# Patient Record
Sex: Male | Born: 1965 | Hispanic: No | Marital: Married | Smoking: Never smoker
Health system: Southern US, Community
[De-identification: ages and names within clinical notes are randomized; demographics above are authoritative.]

## PROBLEM LIST (undated history)

## (undated) DIAGNOSIS — G4733 Obstructive sleep apnea (adult) (pediatric): Secondary | ICD-10-CM

## (undated) DIAGNOSIS — T1491XA Suicide attempt, initial encounter: Secondary | ICD-10-CM

## (undated) DIAGNOSIS — I10 Essential (primary) hypertension: Secondary | ICD-10-CM

## (undated) DIAGNOSIS — R519 Headache, unspecified: Secondary | ICD-10-CM

## (undated) DIAGNOSIS — G8929 Other chronic pain: Secondary | ICD-10-CM

## (undated) DIAGNOSIS — M549 Dorsalgia, unspecified: Secondary | ICD-10-CM

## (undated) DIAGNOSIS — T4145XA Adverse effect of unspecified anesthetic, initial encounter: Secondary | ICD-10-CM

## (undated) DIAGNOSIS — F419 Anxiety disorder, unspecified: Secondary | ICD-10-CM

## (undated) DIAGNOSIS — T8859XA Other complications of anesthesia, initial encounter: Secondary | ICD-10-CM

## (undated) DIAGNOSIS — R51 Headache: Secondary | ICD-10-CM

## (undated) HISTORY — PX: CHOLECYSTECTOMY: SHX55

---

## 2006-03-30 ENCOUNTER — Observation Stay (HOSPITAL_COMMUNITY): Admission: EM | Admit: 2006-03-30 | Discharge: 2006-03-31 | Payer: Self-pay | Admitting: Emergency Medicine

## 2006-04-14 ENCOUNTER — Encounter: Admission: RE | Admit: 2006-04-14 | Discharge: 2006-07-13 | Payer: Self-pay | Admitting: Internal Medicine

## 2012-02-28 ENCOUNTER — Encounter (HOSPITAL_COMMUNITY): Payer: Self-pay | Admitting: *Deleted

## 2012-02-28 ENCOUNTER — Observation Stay (HOSPITAL_COMMUNITY)
Admission: EM | Admit: 2012-02-28 | Discharge: 2012-03-01 | Disposition: A | Discharge status: Home / Self Care | Payer: 59 | Attending: Cardiology | Admitting: Cardiology

## 2012-02-28 ENCOUNTER — Emergency Department (HOSPITAL_COMMUNITY): Payer: 59

## 2012-02-28 DIAGNOSIS — D649 Anemia, unspecified: Secondary | ICD-10-CM | POA: Insufficient documentation

## 2012-02-28 DIAGNOSIS — E669 Obesity, unspecified: Secondary | ICD-10-CM

## 2012-02-28 DIAGNOSIS — R079 Chest pain, unspecified: Principal | ICD-10-CM

## 2012-02-28 DIAGNOSIS — R0602 Shortness of breath: Secondary | ICD-10-CM | POA: Insufficient documentation

## 2012-02-28 DIAGNOSIS — I2510 Atherosclerotic heart disease of native coronary artery without angina pectoris: Secondary | ICD-10-CM | POA: Insufficient documentation

## 2012-02-28 DIAGNOSIS — I10 Essential (primary) hypertension: Secondary | ICD-10-CM | POA: Diagnosis present

## 2012-02-28 DIAGNOSIS — Z8249 Family history of ischemic heart disease and other diseases of the circulatory system: Secondary | ICD-10-CM | POA: Insufficient documentation

## 2012-02-28 DIAGNOSIS — E1169 Type 2 diabetes mellitus with other specified complication: Secondary | ICD-10-CM | POA: Diagnosis present

## 2012-02-28 DIAGNOSIS — E781 Pure hyperglyceridemia: Secondary | ICD-10-CM

## 2012-02-28 DIAGNOSIS — G4733 Obstructive sleep apnea (adult) (pediatric): Secondary | ICD-10-CM

## 2012-02-28 DIAGNOSIS — E119 Type 2 diabetes mellitus without complications: Secondary | ICD-10-CM

## 2012-02-28 HISTORY — DX: Obstructive sleep apnea (adult) (pediatric): G47.33

## 2012-02-28 HISTORY — DX: Essential (primary) hypertension: I10

## 2012-02-28 LAB — GLUCOSE, CAPILLARY
Glucose-Capillary: 63 mg/dL — ABNORMAL LOW (ref 70–99)
Glucose-Capillary: 111 mg/dL — ABNORMAL HIGH (ref 70–99)

## 2012-02-28 LAB — COMPREHENSIVE METABOLIC PANEL
AST: 18 U/L (ref 0–37)
Albumin: 3.4 g/dL — ABNORMAL LOW (ref 3.5–5.2)
Calcium: 9 mg/dL (ref 8.4–10.5)
Chloride: 105 mEq/L (ref 96–112)
Creatinine, Ser: 0.89 mg/dL (ref 0.50–1.35)
Total Protein: 7.2 g/dL (ref 6.0–8.3)

## 2012-02-28 LAB — CBC WITH DIFFERENTIAL/PLATELET
Basophils Absolute: 0 10*3/uL (ref 0.0–0.1)
Basophils Relative: 0 % (ref 0–1)
Eosinophils Relative: 3 % (ref 0–5)
HCT: 38.7 % — ABNORMAL LOW (ref 39.0–52.0)
MCHC: 32 g/dL (ref 30.0–36.0)
Monocytes Absolute: 0.6 10*3/uL (ref 0.1–1.0)
Neutro Abs: 7 10*3/uL (ref 1.7–7.7)
RDW: 14.9 % (ref 11.5–15.5)

## 2012-02-28 LAB — CBC
MCHC: 31.9 g/dL (ref 30.0–36.0)
RDW: 14.8 % (ref 11.5–15.5)

## 2012-02-28 LAB — CREATININE, SERUM
Creatinine, Ser: 0.97 mg/dL (ref 0.50–1.35)
GFR calc non Af Amer: >90 mL/min (ref >90)

## 2012-02-28 LAB — CARDIAC PANEL(CRET KIN+CKTOT+MB+TROPI)
CK, MB: 3.1 ng/mL (ref 0.3–4.0)
Relative Index: 1.5 (ref 0.0–2.5)
Total CK: 210 U/L (ref 7–232)
Troponin I: <0.3 ng/mL (ref <0.30)

## 2012-02-28 MED ORDER — SODIUM CHLORIDE 0.9 % IJ SOLN: 3.0000 mL | INTRAMUSCULAR | Status: DC | PRN

## 2012-02-28 MED ORDER — ONDANSETRON HCL 4 MG/2ML IJ SOLN: 4.0000 mg | Freq: Four times a day (QID) | INTRAMUSCULAR | Status: DC | PRN

## 2012-02-28 MED ORDER — GLIMEPIRIDE 4 MG PO TABS
4.0000 mg | ORAL_TABLET | Freq: Every day | ORAL | Status: DC
  Administered 2012-03-01: 4 mg via ORAL
  Filled 2012-02-28 (×3): qty 1

## 2012-02-28 MED ORDER — ACETAMINOPHEN 325 MG PO TABS: 650.0000 mg | ORAL_TABLET | ORAL | Status: DC | PRN

## 2012-02-28 MED ORDER — INSULIN ASPART 100 UNIT/ML ~~LOC~~ SOLN: 0.0000 [IU] | Freq: Three times a day (TID) | SUBCUTANEOUS | Status: DC

## 2012-02-28 MED ORDER — ENOXAPARIN SODIUM 150 MG/ML ~~LOC~~ SOLN
1.0000 mg/kg | Freq: Two times a day (BID) | SUBCUTANEOUS | Status: DC
  Filled 2012-02-28 (×2): qty 1

## 2012-02-28 MED ORDER — PANTOPRAZOLE SODIUM 40 MG PO TBEC
40.0000 mg | DELAYED_RELEASE_TABLET | Freq: Every day | ORAL | Status: DC
  Administered 2012-02-28 – 2012-03-01 (×2): 40 mg via ORAL
  Filled 2012-02-28 (×2): qty 1

## 2012-02-28 MED ORDER — EXENATIDE 10 MCG/0.04ML ~~LOC~~ SOPN: 10.0000 ug | PEN_INJECTOR | Freq: Two times a day (BID) | SUBCUTANEOUS | Status: DC

## 2012-02-28 MED ORDER — LISINOPRIL 20 MG PO TABS
20.0000 mg | ORAL_TABLET | Freq: Every day | ORAL | Status: DC
  Administered 2012-02-28 – 2012-03-01 (×3): 20 mg via ORAL
  Filled 2012-02-28 (×3): qty 1

## 2012-02-28 MED ORDER — ASPIRIN EC 81 MG PO TBEC
81.0000 mg | DELAYED_RELEASE_TABLET | Freq: Every day | ORAL | Status: DC
  Administered 2012-02-29 – 2012-03-01 (×2): 81 mg via ORAL
  Filled 2012-02-28 (×2): qty 1

## 2012-02-28 MED ORDER — ENOXAPARIN SODIUM 150 MG/ML ~~LOC~~ SOLN
150.0000 mg | Freq: Two times a day (BID) | SUBCUTANEOUS | Status: DC
  Administered 2012-02-28 – 2012-03-01 (×4): 150 mg via SUBCUTANEOUS
  Filled 2012-02-28 (×5): qty 1

## 2012-02-28 MED ORDER — NITROGLYCERIN 0.4 MG SL SUBL: 0.4000 mg | SUBLINGUAL_TABLET | SUBLINGUAL | Status: DC | PRN

## 2012-02-28 MED ORDER — SODIUM CHLORIDE 0.9 % IV SOLN: 250.0000 mL | INTRAVENOUS | Status: DC | PRN

## 2012-02-28 MED ORDER — INSULIN ASPART 100 UNIT/ML ~~LOC~~ SOLN: 0.0000 [IU] | Freq: Every day | SUBCUTANEOUS | Status: DC

## 2012-02-28 MED ORDER — SODIUM CHLORIDE 0.9 % IJ SOLN
3.0000 mL | Freq: Two times a day (BID) | INTRAMUSCULAR | Status: DC
  Administered 2012-02-28 – 2012-03-01 (×4): 3 mL via INTRAVENOUS

## 2012-02-28 NOTE — Progress Notes (Signed)
Initial CBG 59. Repeat CBG 166. Given hypoglycemic event with DM, will restart Byetta tomorrow. Will also write to continue Amaryl starting tomorrow with parameter to hold if NPO. Hold metformin for now in case he needs cath. Allyana Vogan PA-C

## 2012-02-28 NOTE — ED Notes (Signed)
Received report from Ed RN

## 2012-02-28 NOTE — ED Notes (Signed)
Patient is now awaiting transport to 2012. No reports of chest pain or shortness of breath at present.

## 2012-02-28 NOTE — ED Notes (Signed)
Pt transported to xray 

## 2012-02-28 NOTE — ED Notes (Signed)
Cp - substernal, tight, diaphoretic, sob. Onset: cp and nausea early am prior to work and continued for 2-3 hrs. At work. No hx. Of mi. 324 mg asa given. X 4 sl ntg. 0.4 mg - pain level now 3/10. ECG unremarkable. 20 ga piv rt. Wrist.

## 2012-02-28 NOTE — ED Provider Notes (Addendum)
History     CSN: [id number]  Arrival date & time 02/28/12  1237   First MD Initiated Contact with Patient 02/28/12 1250      Chief Complaint  Patient presents with  . Chest Pain    (Consider location/radiation/quality/duration/timing/severity/associated sxs/prior treatment) Patient is a 46 y.o. male presenting with chest pain. The history is provided by the patient.  Chest Pain The chest pain began 3 - 5 hours ago. Duration of episode(s) is 3 hours. Chest pain occurs constantly. The chest pain is resolved. Associated with: nothing. At its most intense, the pain is at 9/10. The pain is currently at 0/10. The severity of the pain is severe. The quality of the pain is described as heavy, pressure-like and squeezing. The pain does not radiate. Exacerbated by: nothing. Primary symptoms include shortness of breath and nausea. Pertinent negatives for primary symptoms include no fever, no cough, no palpitations, no abdominal pain and no vomiting.  Pertinent negatives for associated symptoms include no diaphoresis and no weakness. He tried nitroglycerin and aspirin (Nitroglycerin and aspirin given by EMS which drastically improved his symptoms) for the symptoms.  His past medical history is significant for diabetes, hyperlipidemia and hypertension.  Pertinent negatives for past medical history include no MI.  His family medical history is significant for CAD in family and early MI in family. Family history comments: Multiple family members including a father and a brother in their 3s who died of MI.  Procedure history is positive for exercise treadmill test.     Past Medical History  Diagnosis Date  . Diabetes mellitus   . Coronary artery disease   . Hypertension     No past surgical history on file.  No family history on file.  History  Substance Use Topics  . Smoking status: Not on file  . Smokeless tobacco: Not on file  . Alcohol Use:       Review of Systems  Constitutional:  Negative for fever and diaphoresis.  Respiratory: Positive for shortness of breath. Negative for cough.   Cardiovascular: Positive for chest pain. Negative for palpitations.  Gastrointestinal: Positive for nausea. Negative for vomiting and abdominal pain.  Neurological: Negative for weakness.  All other systems reviewed and are negative.    Allergies  Review of patient's allergies indicates no known allergies.  Home Medications   Current Outpatient Rx  Name Route Sig Dispense Refill  . ASPIRIN EC 81 MG PO TBEC Oral Take 81 mg by mouth daily.    Marland Kitchen EXENATIDE 10 MCG/0.04ML Enoch SOLN Subcutaneous Inject 10 mcg into the skin 2 (two) times daily with a meal.    . GLIMEPIRIDE 4 MG PO TABS Oral Take 4 mg by mouth daily before breakfast.    . LISINOPRIL 20 MG PO TABS Oral Take 20 mg by mouth daily.    Marland Kitchen METFORMIN HCL 1000 MG PO TABS Oral Take 1,000 mg by mouth 2 (two) times daily with a meal.      BP 112/63  Pulse 86  Temp 98.9 F (37.2 C) (Oral)  Resp 14  SpO2 95%  Physical Exam  Nursing note and vitals reviewed. Constitutional: He is oriented to person, place, and time. He appears well-developed and well-nourished. No distress.       obese  HENT:  Head: Normocephalic and atraumatic.  Mouth/Throat: Oropharynx is clear and moist.  Eyes: Conjunctivae and EOM are normal. Pupils are equal, round, and reactive to light.  Neck: Normal range of motion. Neck supple.  Cardiovascular:  Normal rate, regular rhythm and intact distal pulses.   No murmur heard. Pulmonary/Chest: Effort normal and breath sounds normal. No respiratory distress. He has no wheezes. He has no rales.  Abdominal: Soft. He exhibits no distension. There is no tenderness. There is no rebound and no guarding.  Musculoskeletal: Normal range of motion. He exhibits no edema and no tenderness.  Neurological: He is alert and oriented to person, place, and time.  Skin: Skin is warm and dry. No rash noted. No erythema.    Psychiatric: He has a normal mood and affect. His behavior is normal.    ED Course  Procedures (including critical care time)  Labs Reviewed  GLUCOSE, CAPILLARY - Abnormal; Notable for the following:    Glucose-Capillary 63 (*)     All other components within normal limits  CBC WITH DIFFERENTIAL - Abnormal; Notable for the following:    WBC 10.6 (*)     Hemoglobin 12.4 (*)     HCT 38.7 (*)     MCH 25.0 (*)     All other components within normal limits  COMPREHENSIVE METABOLIC PANEL - Abnormal; Notable for the following:    Glucose, Bld 59 (*)     Albumin 3.4 (*)     Total Bilirubin 0.2 (*)     All other components within normal limits  CARDIAC PANEL(CRET KIN+CKTOT+MB+TROPI)   Dg Chest 2 View  02/28/2012  *RADIOLOGY REPORT*  Clinical Data: Chest pain and shortness of breath.  CHEST - 2 VIEW  Comparison: None.  Findings: Lungs are clear.  Heart size is normal.  No pneumothorax or pleural fluid.  IMPRESSION: Negative chest.  Original Report Authenticated By: Bernadene Bell. Maricela Curet, M.D.     Date: 02/28/2012  Rate: 84  Rhythm: normal sinus rhythm  QRS Axis: normal  Intervals: normal  ST/T Wave abnormalities: normal  Conduction Disutrbances: none  Narrative Interpretation: unremarkable     1. Chest pain       MDM   Pt with symptoms concerning for ACS.  TIMI 1 for risk factors. Associated symptoms include SOB, nausea.  PERC neg. ASA and NTG given.  Which improved pain. EKG, CXR, CBC, BMP, CE, Coags pending.  2:51 PM Initial labs within normal limits. Patient was discussed with cardiology and they will come and evaluate him as I feel he would be a good candidate for catheterization the       Gwyneth Sprout, MD 02/28/12 1451  Gwyneth Sprout, MD 02/28/12 1452

## 2012-02-28 NOTE — Progress Notes (Signed)
ANTICOAGULATION CONSULT NOTE - Initial Consult  Pharmacy Consult for Lovenox Indication: chest pain/ACS  No Known Allergies  Patient Measurements: Height: 5\' 10"  (177.8 cm) Weight: 326 lb 4.5 oz (148 kg) IBW/kg (Calculated) : 73  Heparin Dosing Weight:   Vital Signs: Temp: 98.6 F (37 C) (07/22 2019) Temp src: Oral (07/22 2019) BP: 134/83 mmHg (07/22 2019) Pulse Rate: 85  (07/22 2019)  Labs:  Basename 02/28/12 1312 02/28/12 1311  HGB -- 12.4*  HCT -- 38.7*  PLT -- 204  APTT -- --  LABPROT -- --  INR -- --  HEPARINUNFRC -- --  CREATININE -- 0.89  CKTOTAL 208 --  CKMB 3.1 --  TROPONINI <0.30 --    Estimated Creatinine Clearance: 152.7 ml/min (by C-G formula based on Cr of 0.89).   Medical History: Past Medical History  Diagnosis Date  . Diabetes mellitus   . Hypertension   . Obstructive sleep apnea     Medications:  Scheduled:    . aspirin EC  81 mg Oral Daily  . enoxaparin (LOVENOX) injection  150 mg Subcutaneous Q12H  . exenatide  10 mcg Subcutaneous BID WC  . glimepiride  4 mg Oral QAC breakfast  . insulin aspart  0-5 Units Subcutaneous QHS  . insulin aspart  0-9 Units Subcutaneous TID WC  . lisinopril  20 mg Oral Daily  . pantoprazole  40 mg Oral Q0600  . sodium chloride  3 mL Intravenous Q12H  . DISCONTD: enoxaparin (LOVENOX) injection  1 mg/kg Subcutaneous Q12H    Assessment: 46 yr old male admitted with chest pain. Enzymes neg x 1. For nuclear stress test in AM. Goal of Therapy:  Monitor platelets by anticoagulation protocol: Yes   Plan: Lovenox 150 mg SQ q12hrs. CBC 2 3 days while on Lovenox.    Samuel Simon 02/28/2012,9:29 PM

## 2012-02-28 NOTE — ED Notes (Signed)
CBG 166 

## 2012-02-28 NOTE — ED Notes (Signed)
Provided pt with [place] sandwich, apple sauce and apple juice.

## 2012-02-28 NOTE — H&P (Signed)
History and Physical  Patient ID: Samuel Simon MRN: [id number], DOB: 03-May-1966 Date of Encounter: 02/28/2012, 6:08 PM Primary Physician: Provider Not In System Primary Cardiologist: New consult  Chief Complaint: chest pain  HPI: 46 y/o male with PMH T2DM, HTN, OSA and family history significant for CAD presents with chief complaint of chest pain. ~9:30am this morning the patient felt chest pressure, SOB, nausea, and sweaty that came on suddenly while sitting at his work desk. His chest pressure was located in the middle of his chest and rated 8/10. The chest pain did not radiate. His symptoms persisted for ~2 hours at which point he told his boss who dialed 911. He chewed 1 full-size ASA per the 911-instructions which provided no relief of his symptoms. When EMS arrived he was given SL NTG x 4 which relieved his chest pressure. He denies vomiting, syncope, palpitations, abdominal pain, leg pain, leg swelling, and recent long distance travel. EMS brought him to the [facility] ED where he currently reports 1/10 chest pressure but denies SOB, nausea, and diaphoresis. Workup in the ED includes an EKG in NSR with HR 84, cardiac enzymes negative x 1, and CXR negative for acute cardiopulmonary findings. The patient reports previous episodes of intermittent chest pain, the last of which was over 1 year ago, and previous stress tests and echocardiograms. He is unsure of their specific findings.   Past Medical History  Diagnosis Date  . Diabetes mellitus   . Hypertension   . Obstructive sleep apnea      Most Recent Cardiac Studies: No records available   Surgical History:  Past Surgical History  Procedure Date  . Cholecystectomy      Home Meds: Prior to Admission medications   Medication Sig Start Date End Date Taking? Authorizing Provider  aspirin EC 81 MG tablet Take 81 mg by mouth daily.   Yes Historical Provider, MD  exenatide (BYETTA) 10 MCG/0.04ML SOLN Inject 10 mcg into the skin  2 (two) times daily with a meal.   Yes Historical Provider, MD  glimepiride (AMARYL) 4 MG tablet Take 4 mg by mouth daily before breakfast.   Yes Historical Provider, MD  lisinopril (PRINIVIL,ZESTRIL) 20 MG tablet Take 20 mg by mouth daily.   Yes Historical Provider, MD  metFORMIN (GLUCOPHAGE) 1000 MG tablet Take 1,000 mg by mouth 2 (two) times daily with a meal.   Yes Historical Provider, MD    Allergies: No Known Allergies  History   Social History  . Marital Status: Married    Spouse Name: N/A    Number of Children: N/A  . Years of Education: N/A   Occupational History  . Not on file.   Social History Main Topics  . Smoking status: Never Smoker   . Smokeless tobacco: Not on file  . Alcohol Use: 0.6 oz/week    1 Glasses of wine per week     1 glass wine per week/biweekly  . Drug Use: No  . Sexually Active: Not on file   Other Topics Concern  . Not on file   Social History Narrative  . No narrative on file     Family History  Problem Relation Age of Onset  . Heart attack Father   . Heart attack Brother   . Diabetes Mother   . Hypertension Mother   . Hypertension Father     Review of Systems: General: negative for chills, fever, night sweats or weight changes.  Cardiovascular: +chest pain, +SOB, +PND, +orthopnea. Negative for  edema or dyspnea on exertion Dermatological: negative for rash Respiratory: negative for cough or wheezing Urologic: negative for hematuria Abdominal: +nausea. Negative for vomiting, diarrhea, bright red blood per rectum, melena, or hematemesis Neurologic: negative for visual changes, syncope, or dizziness All other systems reviewed and are otherwise negative except as noted above.  Labs:   Lab Results  Component Value Date   WBC 10.6* 02/28/2012   HGB 12.4* 02/28/2012   HCT 38.7* 02/28/2012   MCV 78.0 02/28/2012   PLT 204 02/28/2012     Lab 02/28/12 1311  NA 140  K 4.1  CL 105  CO2 24  BUN 16  CREATININE 0.89  CALCIUM 9.0    PROT 7.2  BILITOT 0.2*  ALKPHOS 70  ALT 20  AST 18  GLUCOSE 59*    Basename 02/28/12 1312  CKTOTAL 208  CKMB 3.1  TROPONINI <0.30    Radiology/Studies:  Dg Chest 2 View  02/28/2012  *RADIOLOGY REPORT*  Clinical Data: Chest pain and shortness of breath.  CHEST - 2 VIEW  Comparison: None.  Findings: Lungs are clear.  Heart size is normal.  No pneumothorax or pleural fluid.  IMPRESSION: Negative chest.  Original Report Authenticated By: Bernadene Bell. D'ALESSIO, M.D.     EKG: NSR, HR 84, not acute changes  Physical Exam: Blood pressure 120/64, pulse 100, temperature 98.9 F (37.2 C), temperature source Oral, resp. rate 18, SpO2 97.00%. General: Well developed, well nourished, obese male in no acute distress. Head: Normocephalic, atraumatic, sclera non-icteric, no xanthomas, nares are without discharge.  Neck: Negative for carotid bruits. JVD not elevated. Lungs: Clear bilaterally to auscultation without wheezes, rales, or rhonchi. Breathing is unlabored. Heart: RRR with S1 S2. No murmurs, rubs, or gallops appreciated. Abdomen: Soft, obese, non-tender, non-distended with normoactive bowel sounds. No hepatomegaly. No rebound/guarding. No obvious abdominal masses. Msk:  Strength and tone appear normal for age. Extremities: No clubbing or cyanosis. No edema.  Distal pedal pulses are 2+ and equal bilaterally. Neuro: Alert and oriented X 3. Moves all extremities spontaneously. Psych:  Responds to questions appropriately with a normal affect.    ASSESSMENT AND PLAN:   1. Chest pain: patient with PMH T2DM, HTN, and significant family history of CAD. He reports previous episodes of chest pain (over 1 year ago), never with exertion, and with negative cardiac exams including stress tests and echo. EKG shows NSR with HR 84, cardiac enzymes negatives x 1, and normal heart per x-ray. Will admit, cycle enzymes, anticoagulate with Lovenox given slight residual pain. Plan for treadmill nuclear study  tomorrow (may have to be 2 day study).   2. HTN: controlled. Continue lisinopril.   3. T2DM: CBG low today. Patient just ate - will recheck. Hold Metformin. Add CBG checks & sliding scale insulin.    4. Mild anemia - add PPI, trend.  Signed, Ronie Spies PA-C 02/28/2012, 6:08 PM Patient seen and examined and history reviewed. Agree with above findings and plan. Patient has multiple cardiac risk factors including HTN, DM, and family history of premature CAD. Presents with prolonged symptoms of chest pain today. Symptoms are consistent with angina. He has had prior cholecystectomy. Has obesity with OSA. Now pain free. Initial work up negative. He needs to be ruled out with serial cardiac enzymes and Ecgs. If negative would recommend nuclear stress test in am. If positive will need cath. Prior cardiac workup with [facility] in [facility] including regular stress test and Echo were apparently OK 2-3 years ago.  Theron Arista  Greig Castilla [facility] 02/28/2012 6:11 PM

## 2012-02-28 NOTE — ED Notes (Signed)
Patient is now being transported to unit 2000.

## 2012-02-28 NOTE — ED Notes (Signed)
Vital signs stable. 

## 2012-02-29 ENCOUNTER — Inpatient Hospital Stay (HOSPITAL_COMMUNITY): Payer: 59

## 2012-02-29 ENCOUNTER — Encounter (HOSPITAL_COMMUNITY): Payer: Self-pay

## 2012-02-29 DIAGNOSIS — R079 Chest pain, unspecified: Secondary | ICD-10-CM

## 2012-02-29 LAB — LIPID PANEL: LDL Cholesterol: 62 mg/dL (ref 0–99)

## 2012-02-29 LAB — CARDIAC PANEL(CRET KIN+CKTOT+MB+TROPI)
CK, MB: 3 ng/mL (ref 0.3–4.0)
Relative Index: 1.4 (ref 0.0–2.5)
Relative Index: 1.6 (ref 0.0–2.5)
Total CK: 207 U/L (ref 7–232)
Total CK: 187 U/L (ref 7–232)

## 2012-02-29 LAB — BASIC METABOLIC PANEL
BUN: 14 mg/dL (ref 6–23)
Calcium: 9.1 mg/dL (ref 8.4–10.5)
Creatinine, Ser: 0.99 mg/dL (ref 0.50–1.35)
GFR calc Af Amer: >90 mL/min (ref >90)
GFR calc non Af Amer: >90 mL/min (ref >90)

## 2012-02-29 LAB — CBC
HCT: 39.6 % (ref 39.0–52.0)
MCHC: 32.1 g/dL (ref 30.0–36.0)
MCV: 79.2 fL (ref 78.0–100.0)
RDW: 14.9 % (ref 11.5–15.5)

## 2012-02-29 LAB — GLUCOSE, CAPILLARY
Glucose-Capillary: 142 mg/dL — ABNORMAL HIGH (ref 70–99)
Glucose-Capillary: 151 mg/dL — ABNORMAL HIGH (ref 70–99)
Glucose-Capillary: 125 mg/dL — ABNORMAL HIGH (ref 70–99)

## 2012-02-29 MED ORDER — REGADENOSON 0.4 MG/5ML IV SOLN
INTRAVENOUS | Status: AC
  Administered 2012-02-29: 0.4 mg via INTRAVENOUS
  Filled 2012-02-29: qty 5

## 2012-02-29 MED ORDER — TECHNETIUM TO 99M ALBUMIN AGGREGATED: 3.0000 | Freq: Once | INTRAVENOUS | Status: AC | PRN

## 2012-02-29 MED ORDER — XENON XE 133 GAS: 20.0000 | GAS_FOR_INHALATION | Freq: Once | RESPIRATORY_TRACT | Status: AC | PRN

## 2012-02-29 MED ORDER — REGADENOSON 0.4 MG/5ML IV SOLN
0.4000 mg | Freq: Once | INTRAVENOUS | Status: AC
  Administered 2012-02-29: 0.4 mg via INTRAVENOUS

## 2012-02-29 NOTE — Care Management Note (Unsigned)
    Page 1 of 1   02/29/2012     11:00:20 AM   CARE MANAGEMENT NOTE 02/29/2012  Patient:  Samuel Simon,Samuel Simon   Account Number:  [id number]  Date Initiated:  02/29/2012  Documentation initiated by:  SIMMONS,Cale Bethard  Subjective/Objective Assessment:   ADMITTED WITH CHEST PAIN; LIVES AT HOME WITH WIFE; WAS IPTA.     Action/Plan:   DISCHARGE PLANNING INITIATED.   Anticipated DC Date:  02/29/2012   Anticipated DC Plan:  HOME/SELF CARE         Choice offered to / List presented to:             Status of service:  In process, will continue to follow Medicare Important Message given?   (If response is "NO", the following Medicare IM given date fields will be blank) Date Medicare IM given:   Date Additional Medicare IM given:    Discharge Disposition:    Per UR Regulation:  Reviewed for med. necessity/level of care/duration of stay  If discussed at Long Length of Stay Meetings, dates discussed:    Comments:  02/29/12  1059  Jadd Gasior SIMMONS RN, BSN [phone] NCM WILL FOLLOW.

## 2012-02-29 NOTE — Progress Notes (Signed)
   TELEMETRY: Reviewed telemetry pt in NSR: Filed Vitals:   02/29/12 1035 02/29/12 1036 02/29/12 1038 02/29/12 1040  BP: 117/71 122/45 121/52 125/55  Pulse:      Temp:      TempSrc:      Resp:      Height:      Weight:      SpO2:        Intake/Output Summary (Last 24 hours) at 02/29/12 1348 Last data filed at 02/29/12 0800  Gross per 24 hour  Intake      0 ml  Output    650 ml  Net   -650 ml    SUBJECTIVE Feels well today. No further chest pain or SOB.    LABS: Basic Metabolic Panel:  Basename 02/29/12 0320 02/28/12 2113 02/28/12 1311  NA 140 -- 140  K 4.8 -- 4.1  CL 105 -- 105  CO2 27 -- 24  GLUCOSE 119* -- 59*  BUN 14 -- 16  CREATININE 0.99 0.97 --  CALCIUM 9.1 -- 9.0  MG -- -- --  PHOS -- -- --   Liver Function Tests:  Basename 02/28/12 1311  AST 18  ALT 20  ALKPHOS 70  BILITOT 0.2*  PROT 7.2  ALBUMIN 3.4*   CBC:  Basename 02/29/12 0320 02/28/12 2113 02/28/12 1311  WBC 9.4 9.6 --  NEUTROABS -- -- 7.0  HGB 12.7* 11.9* --  HCT 39.6 37.3* --  MCV 79.2 78.0 --  PLT 213 198 --   Cardiac Enzymes:  Basename 02/29/12 0833 02/29/12 0320 02/28/12 2113  CKTOTAL 187 207 210  CKMB 2.9 3.0 2.8  CKMBINDEX -- -- --  TROPONINI <0.30 <0.30 <0.30   Fasting Lipid Panel:  Basename 02/29/12 0320  CHOL 155  HDL 30*  LDLCALC 62  TRIG 161*  CHOLHDL 5.2  LDLDIRECT --    Radiology/Studies:  Dg Chest 2 View  02/28/2012  *RADIOLOGY REPORT*  Clinical Data: Chest pain and shortness of breath.  CHEST - 2 VIEW  Comparison: None.  Findings: Lungs are clear.  Heart size is normal.  No pneumothorax or pleural fluid.  IMPRESSION: Negative chest.  Original Report Authenticated By: Bernadene Bell. Maricela Curet, M.D.    PHYSICAL EXAM General: Well developed, obese, in no acute distress. Head: Normocephalic, atraumatic, sclera non-icteric, no xanthomas, nares are without discharge. Neck: Negative for carotid bruits. JVD not elevated. Lungs: Clear bilaterally to auscultation  without wheezes, rales, or rhonchi. Breathing is unlabored. Heart: RRR S1 S2 without murmurs, rubs, or gallops.  Abdomen: Soft, non-tender, non-distended with normoactive bowel sounds. No hepatomegaly. No rebound/guarding. No obvious abdominal masses. Msk:  Strength and tone appears normal for age. Extremities: No clubbing, cyanosis or edema.  Distal pedal pulses are 2+ and equal bilaterally. Neuro: Alert and oriented X 3. Moves all extremities spontaneously. Psych:  Responds to questions appropriately with a normal affect.  ASSESSMENT AND PLAN: 1. Acute anterior chest pain. Patient has ruled out for MI. Chest pain resolved. Will proceed with Lexiscan Myoview study today. With his weight he will be a 2 day protocol.  2. DM type 2  3. Hypertriglyceridemia  4. OSA   Principal Problem:  *Chest pain with moderate risk of acute coronary syndrome Active Problems:  Diabetes mellitus type 2 in obese  HTN (hypertension)  Obstructive sleep apnea    Signed, Peter Swaziland MD,FACC 02/29/2012 1:52 PM

## 2012-03-01 ENCOUNTER — Observation Stay (HOSPITAL_COMMUNITY): Payer: 59

## 2012-03-01 DIAGNOSIS — E781 Pure hyperglyceridemia: Secondary | ICD-10-CM

## 2012-03-01 MED ORDER — TECHNETIUM TC 99M TETROFOSMIN IV KIT
30.0000 | PACK | Freq: Once | INTRAVENOUS | Status: AC | PRN
  Administered 2012-02-29: 30 via INTRAVENOUS

## 2012-03-01 MED ORDER — TECHNETIUM TC 99M TETROFOSMIN IV KIT
30.0000 | PACK | Freq: Once | INTRAVENOUS | Status: AC | PRN
  Administered 2012-03-01: 30 via INTRAVENOUS

## 2012-03-01 MED ORDER — TECHNETIUM TC 99M TETROFOSMIN IV KIT
10.0000 | PACK | Freq: Once | INTRAVENOUS | Status: AC | PRN
  Administered 2012-02-29: 30 via INTRAVENOUS

## 2012-03-01 NOTE — Discharge Summary (Signed)
Discharge Summary   Patient ID: Samuel Simon,  MRN: [id number], DOB/AGE: 1965/11/24 46 y.o.  Admit date: 02/28/2012 Discharge date: 03/01/2012  Primary Physician: Provider Not In System Primary Cardiologist: New to cardiology  Discharge Diagnoses Principal Problem:  *Chest pain with moderate risk of acute coronary syndrome  - Ruled out for NSTEMI  - Cardiac biomarkers   02/28/2012 13:12 02/28/2012 21:13 02/29/2012 03:20 02/29/2012 08:33  CK, MB 3.1 2.8 3.0 2.9  CK Total 208 210 207 187  Troponin I <0.30 <0.30 <0.30 <0.30    - Lexiscan Myoview- as below, normal myocardial uptake, contractility, wall motion and ejection fraction  Active Problems:  Diabetes mellitus type 2 in obese  - Started on SSI inpatient  - One hypoglycemic event on 02/28/12 (CBG 59)  - SSI was adjusted  - Byetta and Amaryl resumed shortly after for increased glycemic control  - Metformin held on admission, to be continued on discharge  HTN (hypertension)  - BP fairly well-controlled this admission  - Will continue ACEi on discharge  Obstructive sleep apnea  - Stable, management per PCP  Hypertriglyceridemia  - Lipid panel this admission: LDL 62, HDL 30, TG 314, TC 155  Allergies No Known Allergies  Diagnostic Studies/Procedures  CHEST X-RAY - 02/28/12  Comparison: None.  Findings: Lungs are clear. Heart size is normal. No pneumothorax  or pleural fluid.  IMPRESSION:  Negative chest.  LEXISCAN MYOVIEW- TWO DAY - 07/22-23/13  IMPRESSION:  1. Normal myocardial uptake, contractility, wall motion, and  ejection fraction.  History of Present Illness  Samuel Simon is a 46yo male with the above problem list who was admitted to [facility] with chest pain concerning for unstable angina.   He was sitting at work ~ 9:30 AM the morning of admission when he notes experiencing sudden constant substernal chest pressure with associated shortness of breath, nausea and diaphoresis rated at a  8/10. No radiation. This continued for ~ 2 hours, and his boss dialed 911. Full-dose ASA was given, NTG SL x4 with relief. EMS transported to [facility] ED. There, EKG revealed no evidence of ischemia, initial cardiac biomarkers WNL x 1 and CXR as above revealed no acute cardiopulmonary process. Given his significant cardiac risk factors and HPI concerning for unstable angina, he was admitted for ACS rule-out.   Hospital Course   He was started on ACS dose Lovenox. He remained stable without further incidences of chest pain. Cardiac biomarkers as above returned WNL. As above, a lipid panel was ordered revealed good LDL control, low HDL, high triglycerides and normal total cholesterol. The plan was made to pursue Lexiscan Myoview the following day. Given his weight, he was a two-day study. The results are described above, and notable for no evidence of ischemia and preserved LVEF. He was assessed by Dr. Swaziland today and felt to be stable for discharge. He will continue all outpatient medications and follow-up with his PCP in 1-2 weeks for post-hospital follow-up and to further investigate chest pain etiologies.   Discharge Vitals:  Blood pressure 128/88, pulse 99, temperature 98 F (36.7 C), temperature source Oral, resp. rate 20, height 5\' 10"  (1.778 m), weight 148 kg (326 lb 4.5 oz), SpO2 93.00%.   Labs: Recent Labs  [facility] 02/29/12 0320 02/28/12 2113   WBC 9.4 9.6   HGB 12.7* 11.9*   HCT 39.6 37.3*   MCV 79.2 78.0   PLT 213 198   Lab 02/29/12 0320 02/28/12 2113 02/28/12 1311  NA 140 -- 140  K 4.8 -- 4.1  CL 105 -- 105  CO2 27 -- 24  BUN 14 -- 16  CREATININE 0.99 0.97 0.89  CALCIUM 9.1 -- 9.0  PROT -- -- 7.2  BILITOT -- -- 0.2*  ALKPHOS -- -- 70  ALT -- -- 20  AST -- -- 18  AMYLASE -- -- --  LIPASE -- -- --  GLUCOSE 119* -- 59*   Recent Labs  Basename 02/29/12 0833 02/29/12 0320 02/28/12 2113   CKTOTAL 187 207 210   CKMB 2.9 3.0 2.8   CKMBINDEX -- -- --   TROPONINI <0.30 <0.30  <0.30   Recent Labs  Basename 02/29/12 0320   CHOL 155   HDL 30*   LDLCALC 62   TRIG 314*   CHOLHDL 5.2   LDLDIRECT --   Disposition:   Follow-up Information    Please follow up. (Please follow-up with your primary care provider at [facility] for follow-up and further chest pain evaluation. )          Discharge Medications:  Medication List  As of 03/01/2012  2:53 PM   CONTINUE taking these medications         aspirin EC 81 MG tablet      exenatide 10 MCG/0.04ML Soln   Commonly known as: BYETTA      glimepiride 4 MG tablet   Commonly known as: AMARYL      lisinopril 20 MG tablet   Commonly known as: PRINIVIL,ZESTRIL      metFORMIN 1000 MG tablet   Commonly known as: GLUCOPHAGE           Outstanding Labs/Studies: None  Duration of Discharge Encounter: Greater than 30 minutes including physician time.  Signed, R. Hurman Horn, PA-C 03/01/2012, 2:53 PM

## 2012-03-01 NOTE — Discharge Summary (Signed)
Patient seen and examined and history reviewed. Agree with above findings and plan. See rounding note earlier today.  Theron Arista [facility] 03/01/2012 6:07 PM

## 2012-03-01 NOTE — Progress Notes (Signed)
Pt discharged as ordered to home. IV site and telemetry monitor d/c'd without incident. AVS reviewed with patient, verbalized understanding and evaluated with teach back methods.  Pt off unit via wheelchair and staff.

## 2012-03-01 NOTE — Progress Notes (Signed)
TELEMETRY: Reviewed telemetry pt in [facility]: Filed Vitals:   02/29/12 2212 02/29/12 2215 03/01/12 0458 03/01/12 1053  BP: 141/90 138/88 110/75 122/72  Pulse: 115 118 86   Temp:   98.1 F (36.7 C)   TempSrc:   Oral   Resp:   20   Height:      Weight:      SpO2:   90%     Intake/Output Summary (Last 24 hours) at 03/01/12 1336 Last data filed at 03/01/12 1000  Gross per 24 hour  Intake   1200 ml  Output      0 ml  Net   1200 ml    SUBJECTIVE Feels well today. No further chest pain or SOB.    LABS: Basic Metabolic Panel:  Basename 02/29/12 0320 02/28/12 2113 02/28/12 1311  NA 140 -- 140  K 4.8 -- 4.1  CL 105 -- 105  CO2 27 -- 24  GLUCOSE 119* -- 59*  BUN 14 -- 16  CREATININE 0.99 0.97 --  CALCIUM 9.1 -- 9.0  MG -- -- --  PHOS -- -- --   Liver Function Tests:  Basename 02/28/12 1311  AST 18  ALT 20  ALKPHOS 70  BILITOT 0.2*  PROT 7.2  ALBUMIN 3.4*   CBC:  Basename 02/29/12 0320 02/28/12 2113 02/28/12 1311  WBC 9.4 9.6 --  NEUTROABS -- -- 7.0  HGB 12.7* 11.9* --  HCT 39.6 37.3* --  MCV 79.2 78.0 --  PLT 213 198 --   Cardiac Enzymes:  Basename 02/29/12 0833 02/29/12 0320 02/28/12 2113  CKTOTAL 187 207 210  CKMB 2.9 3.0 2.8  CKMBINDEX -- -- --  TROPONINI <0.30 <0.30 <0.30   Fasting Lipid Panel:  Basename 02/29/12 0320  CHOL 155  HDL 30*  LDLCALC 62  TRIG 962*  CHOLHDL 5.2  LDLDIRECT --    Radiology/Studies:  Dg Chest 2 View  02/28/2012  *RADIOLOGY REPORT*  Clinical Data: Chest pain and shortness of breath.  CHEST - 2 VIEW  Comparison: None.  Findings: Lungs are clear.  Heart size is normal.  No pneumothorax or pleural fluid.  IMPRESSION: Negative chest.  Original Report Authenticated By: Bernadene Bell. D'ALESSIO, M.D.   RADIOLOGY REPORT*  Clinical Data: Diabetes and hypertension. A chest pain.  MYOCARDIAL IMAGING WITH SPECT (REST AND PHARMACOLOGIC-STRESS - 2  DAY PROTOCOL)  GATED LEFT VENTRICULAR WALL MOTION STUDY  LEFT VENTRICULAR EJECTION  FRACTION  Technique: Standard myocardial SPECT imaging was performed after  intravenous injection of 30 mCi Tc-66m tetrofosmin at rest. On a  different day, intravenous infusion of lexiscan was performed  under supervision of the Cardiology staff. At peak effect of the  drug, 30 mCi Tc-44m tetrofosmin was injected intravenously and  standard myocardial SPECT imaging was performed. Quantitative  gated imaging was also performed to evaluate left ventricular wall  motion and estimate left ventricular ejection fraction.  Comparison: Two-view chest x-ray 02/28/2012.  Findings: There is satisfactory uptake on both rest and stress  images. No fixed perfusion defects are evident. There is no  inducible ischemia.  Contractility and wall motion is within normal limits.  The cardiac function is normal as well. The estimated diastolic  volume is 105 ml. The estimated systolic volume is 36 ml.  Calculated ejection fraction is 66%, within normal limits.  IMPRESSION:  1. Normal myocardial uptake, contractility, wall motion, and  ejection fraction.  Original Report Authenticated By: Jamesetta Orleans. MATTERN, M.D.       PHYSICAL EXAM General:  Well developed, obese, in no acute distress. Head: Normocephalic, atraumatic, sclera non-icteric, no xanthomas, nares are without discharge. Neck: Negative for carotid bruits. JVD not elevated. Lungs: Clear bilaterally to auscultation without wheezes, rales, or rhonchi. Breathing is unlabored. Heart: RRR S1 S2 without murmurs, rubs, or gallops.  Abdomen: Soft, non-tender, non-distended with normoactive bowel sounds. No hepatomegaly. No rebound/guarding. No obvious abdominal masses. Msk:  Strength and tone appears normal for age. Extremities: No clubbing, cyanosis or edema.  Distal pedal pulses are 2+ and equal bilaterally. Neuro: Alert and oriented X 3. Moves all extremities spontaneously. Psych:  Responds to questions appropriately with a normal  affect.  ASSESSMENT AND PLAN: 1. Acute anterior chest pain. Patient has ruled out for MI. Chest pain resolved.Myoview study is normal without ischemia. EF 66%. Will discharge home today and have patient follow up with primary MD in [facility].  2. DM type 2  3. Hypertriglyceridemia  4. OSA   Principal Problem:  *Chest pain with moderate risk of acute coronary syndrome Active Problems:  Diabetes mellitus type 2 in obese  HTN (hypertension)  Obstructive sleep apnea    Signed, Peter [place] MD,FACC 03/01/2012 1:36 PM

## 2015-07-22 ENCOUNTER — Observation Stay (HOSPITAL_BASED_OUTPATIENT_CLINIC_OR_DEPARTMENT_OTHER)
Admission: EM | Admit: 2015-07-22 | Discharge: 2015-07-23 | Disposition: A | Discharge status: Home / Self Care | Payer: BLUE CROSS/BLUE SHIELD | Attending: Internal Medicine | Admitting: Internal Medicine

## 2015-07-22 ENCOUNTER — Emergency Department (HOSPITAL_BASED_OUTPATIENT_CLINIC_OR_DEPARTMENT_OTHER): Payer: BLUE CROSS/BLUE SHIELD

## 2015-07-22 ENCOUNTER — Encounter (HOSPITAL_BASED_OUTPATIENT_CLINIC_OR_DEPARTMENT_OTHER): Payer: Self-pay | Admitting: Emergency Medicine

## 2015-07-22 DIAGNOSIS — R0602 Shortness of breath: Secondary | ICD-10-CM | POA: Diagnosis not present

## 2015-07-22 DIAGNOSIS — D509 Iron deficiency anemia, unspecified: Secondary | ICD-10-CM | POA: Diagnosis not present

## 2015-07-22 DIAGNOSIS — R079 Chest pain, unspecified: Principal | ICD-10-CM | POA: Diagnosis present

## 2015-07-22 DIAGNOSIS — N289 Disorder of kidney and ureter, unspecified: Secondary | ICD-10-CM

## 2015-07-22 DIAGNOSIS — R42 Dizziness and giddiness: Secondary | ICD-10-CM | POA: Insufficient documentation

## 2015-07-22 DIAGNOSIS — E119 Type 2 diabetes mellitus without complications: Secondary | ICD-10-CM

## 2015-07-22 DIAGNOSIS — Z6841 Body Mass Index (BMI) 40.0 and over, adult: Secondary | ICD-10-CM | POA: Insufficient documentation

## 2015-07-22 DIAGNOSIS — I10 Essential (primary) hypertension: Secondary | ICD-10-CM | POA: Diagnosis present

## 2015-07-22 DIAGNOSIS — E1169 Type 2 diabetes mellitus with other specified complication: Secondary | ICD-10-CM

## 2015-07-22 DIAGNOSIS — E669 Obesity, unspecified: Secondary | ICD-10-CM | POA: Insufficient documentation

## 2015-07-22 DIAGNOSIS — Z79899 Other long term (current) drug therapy: Secondary | ICD-10-CM | POA: Insufficient documentation

## 2015-07-22 DIAGNOSIS — Z7982 Long term (current) use of aspirin: Secondary | ICD-10-CM | POA: Insufficient documentation

## 2015-07-22 DIAGNOSIS — Z560 Unemployment, unspecified: Secondary | ICD-10-CM | POA: Diagnosis not present

## 2015-07-22 DIAGNOSIS — N179 Acute kidney failure, unspecified: Secondary | ICD-10-CM | POA: Diagnosis not present

## 2015-07-22 DIAGNOSIS — G4733 Obstructive sleep apnea (adult) (pediatric): Secondary | ICD-10-CM | POA: Diagnosis present

## 2015-07-22 DIAGNOSIS — R51 Headache: Secondary | ICD-10-CM | POA: Diagnosis not present

## 2015-07-22 HISTORY — DX: Other complications of anesthesia, initial encounter: T88.59XA

## 2015-07-22 HISTORY — DX: Adverse effect of unspecified anesthetic, initial encounter: T41.45XA

## 2015-07-22 LAB — BASIC METABOLIC PANEL
Anion gap: 6 (ref 5–15)
BUN: 33 mg/dL — AB (ref 6–20)
CO2: 24 mmol/L (ref 22–32)
Calcium: 9.1 mg/dL (ref 8.9–10.3)
Chloride: 109 mmol/L (ref 101–111)
Creatinine, Ser: 1.76 mg/dL — ABNORMAL HIGH (ref 0.61–1.24)
GFR calc Af Amer: 51 mL/min — ABNORMAL LOW (ref >60)
GFR, EST NON AFRICAN AMERICAN: 44 mL/min — AB (ref >60)
GLUCOSE: 87 mg/dL (ref 65–99)
POTASSIUM: 4 mmol/L (ref 3.5–5.1)
Sodium: 139 mmol/L (ref 135–145)

## 2015-07-22 LAB — CBC WITH DIFFERENTIAL/PLATELET
BASOS ABS: 0 10*3/uL (ref 0.0–0.1)
Basophils Relative: 0 %
EOS PCT: 2 %
Eosinophils Absolute: 0.2 10*3/uL (ref 0.0–0.7)
HCT: 37.3 % — ABNORMAL LOW (ref 39.0–52.0)
Hemoglobin: 11.7 g/dL — ABNORMAL LOW (ref 13.0–17.0)
LYMPHS ABS: 2.4 10*3/uL (ref 0.7–4.0)
LYMPHS PCT: 30 %
MCH: 24.8 pg — AB (ref 26.0–34.0)
MCHC: 31.4 g/dL (ref 30.0–36.0)
MCV: 79 fL (ref 78.0–100.0)
MONO ABS: 0.5 10*3/uL (ref 0.1–1.0)
MONOS PCT: 7 %
Neutro Abs: 4.9 10*3/uL (ref 1.7–7.7)
Neutrophils Relative %: 61 %
PLATELETS: 202 10*3/uL (ref 150–400)
RBC: 4.72 MIL/uL (ref 4.22–5.81)
RDW: 14.8 % (ref 11.5–15.5)
WBC: 8 10*3/uL (ref 4.0–10.5)

## 2015-07-22 LAB — GLUCOSE, CAPILLARY
GLUCOSE-CAPILLARY: 96 mg/dL (ref 65–99)
Glucose-Capillary: 83 mg/dL (ref 65–99)
Glucose-Capillary: 116 mg/dL — ABNORMAL HIGH (ref 65–99)

## 2015-07-22 LAB — TROPONIN I
Troponin I: <0.03 ng/mL (ref <0.031)
Troponin I: <0.03 ng/mL (ref <0.031)

## 2015-07-22 LAB — IRON AND TIBC
IRON: 83 ug/dL (ref 45–182)
Saturation Ratios: 28 % (ref 17.9–39.5)
TIBC: 295 ug/dL (ref 250–450)
UIBC: 212 ug/dL

## 2015-07-22 LAB — VITAMIN B12: Vitamin B-12: 438 pg/mL (ref 180–914)

## 2015-07-22 LAB — FOLATE: Folate: 12 ng/mL (ref >5.9)

## 2015-07-22 LAB — RETICULOCYTES
RBC.: 4.69 MIL/uL (ref 4.22–5.81)
Retic Count, Absolute: 37.5 10*3/uL (ref 19.0–186.0)
Retic Ct Pct: 0.8 % (ref 0.4–3.1)

## 2015-07-22 LAB — FERRITIN: FERRITIN: 192 ng/mL (ref 24–336)

## 2015-07-22 MED ORDER — ENOXAPARIN SODIUM 40 MG/0.4ML ~~LOC~~ SOLN: 40.0000 mg | SUBCUTANEOUS | Status: DC

## 2015-07-22 MED ORDER — NITROGLYCERIN 0.4 MG SL SUBL
0.4000 mg | SUBLINGUAL_TABLET | SUBLINGUAL | Status: AC | PRN
  Administered 2015-07-22 (×3): 0.4 mg via SUBLINGUAL
  Filled 2015-07-22: qty 1

## 2015-07-22 MED ORDER — MORPHINE SULFATE (PF) 4 MG/ML IV SOLN
4.0000 mg | Freq: Once | INTRAVENOUS | Status: AC
  Administered 2015-07-22: 4 mg via INTRAVENOUS
  Filled 2015-07-22: qty 1

## 2015-07-22 MED ORDER — GI COCKTAIL ~~LOC~~: 30.0000 mL | Freq: Four times a day (QID) | ORAL | Status: DC | PRN

## 2015-07-22 MED ORDER — ACETAMINOPHEN 325 MG PO TABS
650.0000 mg | ORAL_TABLET | Freq: Once | ORAL | Status: DC
Start: 2015-07-22 — End: 2015-07-23
  Filled 2015-07-22: qty 2

## 2015-07-22 MED ORDER — SODIUM CHLORIDE 0.9 % IV BOLUS (SEPSIS)
1000.0000 mL | Freq: Once | INTRAVENOUS | Status: AC
  Administered 2015-07-22: 1000 mL via INTRAVENOUS

## 2015-07-22 MED ORDER — FAMOTIDINE 20 MG PO TABS
20.0000 mg | ORAL_TABLET | Freq: Every day | ORAL | Status: DC
  Administered 2015-07-22 – 2015-07-23 (×2): 20 mg via ORAL
  Filled 2015-07-22 (×2): qty 1

## 2015-07-22 MED ORDER — ONDANSETRON HCL 4 MG/2ML IJ SOLN: 4.0000 mg | Freq: Four times a day (QID) | INTRAMUSCULAR | Status: DC | PRN

## 2015-07-22 MED ORDER — SODIUM CHLORIDE 0.9 % IJ SOLN
3.0000 mL | Freq: Two times a day (BID) | INTRAMUSCULAR | Status: DC
  Administered 2015-07-22 (×2): 3 mL via INTRAVENOUS

## 2015-07-22 MED ORDER — ENOXAPARIN SODIUM 40 MG/0.4ML ~~LOC~~ SOLN
40.0000 mg | SUBCUTANEOUS | Status: DC
  Administered 2015-07-22: 40 mg via SUBCUTANEOUS
  Filled 2015-07-22 (×2): qty 0.4

## 2015-07-22 MED ORDER — ONDANSETRON HCL 4 MG PO TABS: 4.0000 mg | ORAL_TABLET | Freq: Four times a day (QID) | ORAL | Status: DC | PRN

## 2015-07-22 MED ORDER — INSULIN ASPART 100 UNIT/ML ~~LOC~~ SOLN: 0.0000 [IU] | Freq: Three times a day (TID) | SUBCUTANEOUS | Status: DC

## 2015-07-22 MED ORDER — ACETAMINOPHEN 325 MG PO TABS: 650.0000 mg | ORAL_TABLET | ORAL | Status: DC | PRN

## 2015-07-22 MED ORDER — ASPIRIN 81 MG PO CHEW
324.0000 mg | CHEWABLE_TABLET | Freq: Once | ORAL | Status: AC
  Administered 2015-07-22: 324 mg via ORAL
  Filled 2015-07-22: qty 4

## 2015-07-22 MED ORDER — ASPIRIN EC 81 MG PO TBEC
81.0000 mg | DELAYED_RELEASE_TABLET | Freq: Every day | ORAL | Status: DC
  Administered 2015-07-22 – 2015-07-23 (×2): 81 mg via ORAL
  Filled 2015-07-22 (×2): qty 1

## 2015-07-22 NOTE — ED Notes (Signed)
MD at bedside. 

## 2015-07-22 NOTE — ED Notes (Signed)
Pt on heart monitor 

## 2015-07-22 NOTE — ED Notes (Signed)
Patient transported to X-ray 

## 2015-07-22 NOTE — Progress Notes (Signed)
Admitted from [facility] chest pain free, put in bed bed comfortably, VS checked oriented to room.

## 2015-07-22 NOTE — Progress Notes (Signed)
[facility] per Dr. Preston FleetingGlick  49 year old man with history of hypertension, diabetes mellitus, OSA, who presents with chest pain.  Patient reports that he has been having intermittent chest pain for 15 years. He had a negative Myoview test on 03/01/12. Today patient started having chest pain again which he feels different from his baseline chest pain. It is associated with shortness of breath, headache and lightheadedness. Chest pain is located in the left chest. No fever, chills.   In ED, patient was found to have negative troponin, no ischemic change on EKG, WBC 8.0, temperature normal, mildly bradycardia, creatinine 1.76 (pt was given referral to nephrologist by PCP per pt), negative chest x-ray. Currently, pt is chest pan free. Pt was  treated with nitroglycerin, morphine, Tylenol and aspirin in emergency room. Pt is accepted to tele bed for observation.  Lorretta HarpXilin Sheylin Scharnhorst, MD  [facility] Pager [phone]  If 7PM-7AM, please contact night-coverage [email or web address] Password [email or web address] 07/22/2015, 4:06 AM

## 2015-07-22 NOTE — ED Provider Notes (Signed)
CSN: [id number]     Arrival date & time 07/22/15  0045 History   First MD Initiated Contact with Patient 07/22/15 0057     Chief Complaint  Patient presents with  . Chest Pain     (Consider location/radiation/quality/duration/timing/severity/associated sxs/prior Treatment) Patient is a [age] y.o. male presenting with chest pain. The history is provided by the patient.  Chest Pain He had also at about 9:30 PM of left-sided chest pain. He describes varying qualities of pain including sharp, dull, tight. Pain seems to move between these different qualities but never goes away. Pain came on at rest and is not affected by body position, breathing, movement. He has had problems for chest pain in the past and has had stress tests in the past. There are significant cardiac risk factors including diabetes and family history point her coronary atherosclerosis. His father had a heart attack at age 49. He takes lisinopril but states that it is for renal protection and not for blood pressure. There is no history of hyperlipidemia. He has not taken anything for his pain at home. Pain is rated at 6/10.  Past Medical History  Diagnosis Date  . Diabetes mellitus   . Hypertension   . Obstructive sleep apnea    Past Surgical History  Procedure Laterality Date  . Cholecystectomy     Family History  Problem Relation Age of Onset  . Heart attack Father   . Heart attack Brother   . Diabetes Mother   . Hypertension Mother   . Hypertension Father    Social History  Substance Use Topics  . Smoking status: Never Smoker   . Smokeless tobacco: None  . Alcohol Use: 0.6 oz/week    1 Glasses of wine per week     Comment: 1 glass wine per week/biweekly    Review of Systems  Cardiovascular: Positive for chest pain.  All other systems reviewed and are negative.     Allergies  Review of patient's allergies indicates no known allergies.  Home Medications   Prior to Admission medications   Medication  Sig Start Date End Date Taking? Authorizing Provider  aspirin EC 81 MG tablet Take 81 mg by mouth daily.    Historical Provider, MD  exenatide (BYETTA) 10 MCG/0.04ML SOLN Inject 10 mcg into the skin 2 (two) times daily with a meal.    Historical Provider, MD  glimepiride (AMARYL) 4 MG tablet Take 4 mg by mouth daily before breakfast.    Historical Provider, MD  lisinopril (PRINIVIL,ZESTRIL) 20 MG tablet Take 20 mg by mouth daily.    Historical Provider, MD  metFORMIN (GLUCOPHAGE) 1000 MG tablet Take 1,000 mg by mouth 2 (two) times daily with a meal.    Historical Provider, MD   BP 122/73 mmHg  Pulse 75  Temp(Src) 98.5 F (36.9 C) (Oral)  Resp 20  Ht 5' 9.5" (1.765 m)  SpO2 96% Physical Exam  Nursing note and vitals reviewed.  49 year old male, resting comfortably and in no acute distress. Vital signs are normal. Oxygen saturation is 96%, which is normal. Head is normocephalic and atraumatic. PERRLA, EOMI. Oropharynx is clear. Neck is nontender and supple without adenopathy or JVD. Back is nontender and there is no CVA tenderness. Lungs are clear without rales, wheezes, or rhonchi. Chest has mild tenderness in the left anterior chest wall. Heart has regular rate and rhythm without murmur. Abdomen is soft, flat, nontender without masses or hepatosplenomegaly and peristalsis is normoactive. Extremities have 1+ edema, full  range of motion is present. Skin is warm and dry without rash. Neurologic: Mental status is normal, cranial nerves are intact, there are no motor or sensory deficits.  ED Course  Procedures (including critical care time) Labs Review Results for orders placed or performed during the hospital encounter of 07/22/15  Basic metabolic panel  Result Value Ref Range   Sodium 139 135 - 145 mmol/L   Potassium 4.0 3.5 - 5.1 mmol/L   Chloride 109 101 - 111 mmol/L   CO2 24 22 - 32 mmol/L   Glucose, Bld 87 65 - 99 mg/dL   BUN 33 (H) 6 - 20 mg/dL   Creatinine, Ser 1.61 (H)  0.61 - 1.24 mg/dL   Calcium 9.1 8.9 - 09.6 mg/dL   GFR calc non Af Amer 44 (L) >60 mL/min   GFR calc Af Amer 51 (L) >60 mL/min   Anion gap 6 5 - 15  CBC with Differential  Result Value Ref Range   WBC 8.0 4.0 - 10.5 K/uL   RBC 4.72 4.22 - 5.81 MIL/uL   Hemoglobin 11.7 (L) 13.0 - 17.0 g/dL   HCT 04.5 (L) 40.9 - 81.1 %   MCV 79.0 78.0 - 100.0 fL   MCH 24.8 (L) 26.0 - 34.0 pg   MCHC 31.4 30.0 - 36.0 g/dL   RDW 91.4 78.2 - 95.6 %   Platelets 202 150 - 400 K/uL   Neutrophils Relative % 61 %   Neutro Abs 4.9 1.7 - 7.7 K/uL   Lymphocytes Relative 30 %   Lymphs Abs 2.4 0.7 - 4.0 K/uL   Monocytes Relative 7 %   Monocytes Absolute 0.5 0.1 - 1.0 K/uL   Eosinophils Relative 2 %   Eosinophils Absolute 0.2 0.0 - 0.7 K/uL   Basophils Relative 0 %   Basophils Absolute 0.0 0.0 - 0.1 K/uL  Troponin I  Result Value Ref Range   Troponin I <0.03 <0.031 ng/mL   Imaging Review Dg Chest 2 View  07/22/2015  CLINICAL DATA:  Chest heaviness and shortness of breath beginning about 2 hours ago. More on the left side. Lightheadedness and has a headache. EXAM: CHEST  2 VIEW COMPARISON:  None. FINDINGS: The heart size and mediastinal contours are within normal limits. Both lungs are clear. The visualized skeletal structures are unremarkable. IMPRESSION: No active cardiopulmonary disease. Electronically Signed   By: Burman Nieves M.D.   On: 07/22/2015 01:51   I have personally reviewed and evaluated these images and lab results as part of my medical decision-making.   EKG Interpretation   Date/Time:  Tuesday July 22 2015 00:54:12 EST Ventricular Rate:  71 PR Interval:  168 QRS Duration: 90 QT Interval:  360 QTC Calculation: 391 R Axis:   48 Text Interpretation:  Normal sinus rhythm Normal ECG No old tracing to  compare Confirmed by [facility]  MD, Madalyn Legner ([id number]) on 07/22/2015 12:57:44 AM      MDM   Final diagnoses:  Chest pain, unspecified chest pain type  Renal insufficiency  Normocytic  hypochromic anemia    Chest pain with typical and atypical components. He has significant risk factors including strongly positive family history of coronary artery disease. Review of old records shows hospitalization 3 years ago at which time no clear stress test was negative. He states he has not had any stress tests since then. He had partial relief of pain with nitroglycerin and complete relief of pain with morphine. Laboratory evaluation shows normal troponin but significant elevation of creatinine  compared with 3 years ago. On further discussion, patient states that he has seen a kidney doctor but does not know what his creatinine has been running. Given his history and partial response nitroglycerin, I feel he needs to be admitted for serial troponins and consideration for urgent stress testing. Case is discussed with Dr. Clyde Lundborg of [facility] who agrees to admit the patient.    Dione Booze, MD 07/22/15 [phone]

## 2015-07-22 NOTE — ED Notes (Addendum)
Patient states that he has a history of Chest pain off and on for the last 15 years. The patient reports that today it is different because he has some chest tightness and SOB with being lightheaded and has a HA. Patient reports that the pain is in his left chest, and when the pain is worse he gets a tingling and shooting pain down his left leg

## 2015-07-22 NOTE — Progress Notes (Signed)
Called Dr. Konrad DoloresMerrell to see if patient could be discharged.  Patient feels that he has not had any more chest pain since in hospital.  Pt feels that he is not having any procedures done and wants to go home tonight with his wife. Pt resting with call bell within reach.  Will continue to monitor. Thomas HoffBurton, Heith Haigler McClintock, RN

## 2015-07-22 NOTE — H&P (Signed)
[facility] History and Physical  Jakarri Tadesse ZOX:[id number] DOB: July 07, 1966 DOA: 07/22/2015  Referring physician: Emergency Department PCP: Bosie ClosICE,KATHLEEN M, MD   CHIEF COMPLAINT: chest pain  HPI: Samuel Simon is a 49 y.o. male with T2DM, HTN, OSA and chronic, intermittent chest pain [facility] with chest pain associated with chest tightness, SOB and lightheadedness. He was admitted 3 years ago with chest pain. Cardiac enzymes were normal. Lexiscan Myoview negative for ischemia.   At work on [employer] patient developed some pain / tenderness in vein on right side of neck. He also had some lightheadedness. Last night around 9pm he developed left-sided chest pain. The pain did not radiate into G[place], shoulders, arms or through to back. He had some mild nausea, no diaphoresis. Patient did have some associated tingling in his right lower extremity however. The pain persisted until 1:00am which time patient decided to go to the emergency department after 3 sublingual NTG chest pain resolved. No history of GERD. Patient did not try anything at home for the chest pain prior to coming to the emergency department  ED COURSE:     Labs:   WBC 8, Hgb 11.7, MCV 79 Trop <0.03 x2  CXR:    No active disease  EKG:    Date/Time: Tuesday July 22 2015 00:54:12 EST Ventricular Rate: 71 PR Interval: 168 QRS Duration: 90 QT Interval: 360 QTC Calculation: 391 R Axis: 48 Text Interpretation: Normal sinus rhythm Normal ECG No old tracing to  compare [facility] by [facility], DAVID ([id number]) on 07/22/2015 12:57:44 AM   Medications  acetaminophen (TYLENOL) tablet 650 mg (650 mg Oral Not Given 07/22/15 0247)  aspirin chewable tablet 324 mg (324 mg Oral Given 07/22/15 0135)  nitroGLYCERIN (NITROSTAT) SL tablet 0.4 mg (0.4 mg Sublingual Given 07/22/15 0210)  morphine 4 MG/ML injection 4 mg (4 mg Intravenous Given 07/22/15 0225)  sodium chloride 0.9 % bolus 1,000  mL (1,000 mLs Intravenous New Bag/Given 07/22/15 0245)    Review of Systems  Constitutional: Negative.   Eyes: Negative.   Respiratory: Positive for shortness of breath.   Cardiovascular: Positive for chest pain.  Gastrointestinal: Negative.   Genitourinary: Negative.   Musculoskeletal: Negative.   Skin: Negative.   Neurological: Positive for headaches.  Endo/Heme/Allergies: Negative.   Psychiatric/Behavioral: Negative.     Past Medical History  Diagnosis Date  . Diabetes mellitus   . Hypertension   . Obstructive sleep apnea    Past Surgical History  Procedure Laterality Date  . Cholecystectomy      SOCIAL HISTORY:  reports that he has never smoked. He does not have any smokeless tobacco history on file. He reports that he drinks about 0.6 oz of alcohol per week. He reports that he does not use illicit drugs. Lives:  At home  with wife   Assistive devices:   Occasionally uses a cane for ambulation.   No Known Allergies  Family History  Problem Relation Age of Onset  . Heart attack Father   . Heart attack Brother   . Diabetes Mother   . Hypertension Mother   . Hypertension Father     Prior to Admission medications   Medication Sig Start Date End Date Taking? Authorizing Provider  aspirin EC 81 MG tablet Take 81 mg by mouth daily.    Historical Provider, MD  exenatide (BYETTA) 10 MCG/0.04ML SOLN Inject 10 mcg into the skin 2 (two) times daily with a meal.    Historical Provider, MD  glimepiride (AMARYL) 4  MG tablet Take 4 mg by mouth daily before breakfast.    Historical Provider, MD  lisinopril (PRINIVIL,ZESTRIL) 20 MG tablet Take 20 mg by mouth daily.    Historical Provider, MD  metFORMIN (GLUCOPHAGE) 1000 MG tablet Take 1,000 mg by mouth 2 (two) times daily with a meal.    Historical Provider, MD   PHYSICAL EXAM: Filed Vitals:   07/22/15 0400 07/22/15 0415 07/22/15 0430 07/22/15 0624  BP: 112/83 121/77 105/77 114/78  Pulse: 63 63 67 63  Temp:    97.7 F (36.5  C)  TempSrc:    Oral  Resp: 0 Height:      Weight:    129.4 kg (285 lb 4.4 oz)  SpO2: 97% 97% 99% 100%    Wt Readings from Last 3 Encounters:  07/22/15 129.4 kg (285 lb 4.4 oz)  02/28/12 148 kg (326 lb 4.5 oz)    General:  Pleasant obese male, Appears calm and comfortable Eyes: PER, normal lids, irises & conjunctiva ENT: grossly normal hearing, lips & tongue Neck: no LAD, no masses Cardiovascular: RRR, no murmurs. No LE edema.  Respiratory: Respirations even and unlabored. Normal respiratory effort. Lungs CTA bilaterally, no wheezes / rales .   Abdomen: soft, non-distended, non-tender, active bowel sounds. No obvious masses.  Skin: no rash seen on limited exam Musculoskeletal: grossly normal tone BUE/BLE Psychiatric: grossly normal mood and affect, speech fluent and appropriate Neurologic: grossly non-focal.         LABS ON ADMISSION:    Basic Metabolic Panel:  Recent Labs Lab 07/22/15 0115  NA 139  K 4.0  CL 109  CO2 24  GLUCOSE 87  BUN 33*  CREATININE 1.76*  CALCIUM 9.1   CBC:  Recent Labs Lab 07/22/15 0115  WBC 8.0  NEUTROABS 4.9  HGB 11.7*  HCT 37.3*  MCV 79.0  PLT 202    CREATININE: 1.76 mg/dL ABNORMAL (45/40/98 1191) Estimated creatinine clearance - 68.9 mL/min   ASSESSMENT / PLAN   Chest pain with moderate risk of acute coronary syndrome. Chest pain score 4. He had a negative Myoview 3 years ago. Chest pain occurred at rest. Etiology? Cardiac not excluded, other considerations include GERD, musculoskeletal.  -Admit to observation, telemetry bed -continue with troponin cycling ( first 2 normal) -repeat EKG in am -GI cocktail prn -outpatient cardiac evaluation  Diabetes mellitus type 2 in obese. -Hold home oral diabetic medications -Monitor CBG, start SSI -doesn't check CBGs at home on regular basis, will obtain hgb a1c  HTN , BP stable Not on any BP meds at home. .   Obstructive sleep apnea  AKI. Creatinine 1.76, last one  was 0.99 (3 years ago). He could have CKD vrs AKI.  -recheck am BMET  Obesity. Actively trying to lose weight.   Mild microcytic anemia. No overt GI blood loss.  -check iron studies.    CONSULTANTS: none    Code Status: full code DVT Prophylaxis: Lovenox Family Communication:  Patient alert, oriented and understands plan of care.    Disposition Plan: Discharge to home in 24-48 hours   Time spent: 60 minutes Willette Cluster  NP [facility] Pager [phone]

## 2015-07-23 ENCOUNTER — Other Ambulatory Visit (HOSPITAL_COMMUNITY): Payer: Self-pay

## 2015-07-23 DIAGNOSIS — R079 Chest pain, unspecified: Secondary | ICD-10-CM | POA: Diagnosis not present

## 2015-07-23 DIAGNOSIS — N179 Acute kidney failure, unspecified: Secondary | ICD-10-CM | POA: Diagnosis not present

## 2015-07-23 DIAGNOSIS — E119 Type 2 diabetes mellitus without complications: Secondary | ICD-10-CM | POA: Diagnosis not present

## 2015-07-23 DIAGNOSIS — I10 Essential (primary) hypertension: Secondary | ICD-10-CM | POA: Diagnosis not present

## 2015-07-23 LAB — BASIC METABOLIC PANEL
ANION GAP: 7 (ref 5–15)
BUN: 19 mg/dL (ref 6–20)
CO2: 27 mmol/L (ref 22–32)
Calcium: 9.5 mg/dL (ref 8.9–10.3)
Chloride: 106 mmol/L (ref 101–111)
Creatinine, Ser: 1.55 mg/dL — ABNORMAL HIGH (ref 0.61–1.24)
GFR calc Af Amer: 60 mL/min — ABNORMAL LOW (ref >60)
GFR, EST NON AFRICAN AMERICAN: 51 mL/min — AB (ref >60)
GLUCOSE: 98 mg/dL (ref 65–99)
POTASSIUM: 4.1 mmol/L (ref 3.5–5.1)
SODIUM: 140 mmol/L (ref 135–145)

## 2015-07-23 LAB — HEMOGLOBIN A1C
Hgb A1c MFr Bld: 8.4 % — ABNORMAL HIGH (ref 4.8–5.6)
Mean Plasma Glucose: 194 mg/dL

## 2015-07-23 LAB — GLUCOSE, CAPILLARY
GLUCOSE-CAPILLARY: 110 mg/dL — AB (ref 65–99)
GLUCOSE-CAPILLARY: 103 mg/dL — AB (ref 65–99)

## 2015-07-23 NOTE — Discharge Summary (Signed)
Physician Discharge Summary  Samuel Simon MRN: [id number] DOB/AGE: 1966/03/20 49 y.o.  PCP: Garlan Fillers, MD   Admit date: 07/22/2015 Discharge date: 07/23/2015  Discharge Diagnoses:     Active Problems:   Chest pain with moderate risk of acute coronary syndrome   Diabetes mellitus type 2 in obese (HCC)   HTN (hypertension)   Obstructive sleep apnea   AKI (acute kidney injury) ([place])   Pain in the chest   Chest pain   Normocytic hypochromic anemia    Follow-up recommendations Follow-up with PCP in 3-5 days , including all  additional recommended appointments as below Follow-up CBC, CMP in 3-5 days Patient to have a follow-up 2-D echo, possible outpatient stress test due to multiple cardiovascular risk factors     Medication List    STOP taking these medications        glipiZIDE 2.5 MG 24 hr tablet  Commonly known as:  GLUCOTROL XL      TAKE these medications        aspirin EC 81 MG tablet  Take 81 mg by mouth daily.     ergocalciferol 50000 UNITS capsule  Commonly known as:  VITAMIN D2  Take 50,000 Units by mouth once a week.     exenatide 10 MCG/0.04ML Sopn injection  Commonly known as:  BYETTA  Inject 10 mcg into the skin 2 (two) times daily with a meal.     glimepiride 4 MG tablet  Commonly known as:  AMARYL  Take 4 mg by mouth daily before breakfast.     lisinopril 20 MG tablet  Commonly known as:  PRINIVIL,ZESTRIL  Take 20 mg by mouth daily.         Discharge Condition: Patient would like to be discharged today despite his heart score of 5   Discharge Instructions       Discharge Instructions    Diet - low sodium heart healthy    Complete by:  As directed      Increase activity slowly    Complete by:  As directed            No Known Allergies    Disposition: 01-Home or Self Care   Consults: None     Significant Diagnostic Studies:  Dg Chest 2 View  07/22/2015  CLINICAL DATA:  Chest heaviness and shortness  of breath beginning about 2 hours ago. More on the left side. Lightheadedness and has a headache. EXAM: CHEST  2 VIEW COMPARISON:  None. FINDINGS: The heart size and mediastinal contours are within normal limits. Both lungs are clear. The visualized skeletal structures are unremarkable. IMPRESSION: No active cardiopulmonary disease. Electronically Signed   By: Lucienne Capers M.D.   On: 07/22/2015 01:51         Filed Weights   07/22/15 0624 07/23/15 0449  Weight: 129.4 kg (285 lb 4.4 oz) 128.9 kg (284 lb 2.8 oz)     Microbiology: No results found for this or any previous visit (from the past 240 hour(s)).     Blood Culture No results found for: SDES, [place], CULT, REPTSTATUS    Labs: Results for orders placed or performed during the hospital encounter of 07/22/15 (from the past 48 hour(s))  Basic metabolic panel     Status: Abnormal   Collection Time: 07/22/15  1:15 AM  Result Value Ref Range   Sodium 139 135 - 145 mmol/L   Potassium 4.0 3.5 - 5.1 mmol/L   Chloride 109 101 - 111 mmol/L  CO2 24 22 - 32 mmol/L   Glucose, Bld 87 65 - 99 mg/dL   BUN 33 (H) 6 - 20 mg/dL   Creatinine, Ser 1.76 (H) 0.61 - 1.24 mg/dL   Calcium 9.1 8.9 - 10.3 mg/dL   GFR calc non Af Amer 44 (L) >60 mL/min   GFR calc Af Amer 51 (L) >60 mL/min    Comment: (NOTE) The eGFR has been calculated using the CKD EPI equation. This calculation has not been validated in all clinical situations. eGFR's persistently <60 mL/min signify possible Chronic Kidney Disease.    Anion gap 6 5 - 15  CBC with Differential     Status: Abnormal   Collection Time: 07/22/15  1:15 AM  Result Value Ref Range   WBC 8.0 4.0 - 10.5 K/uL   RBC 4.72 4.22 - 5.81 MIL/uL   Hemoglobin 11.7 (L) 13.0 - 17.0 g/dL   HCT 37.3 (L) 39.0 - 52.0 %   MCV 79.0 78.0 - 100.0 fL   MCH 24.8 (L) 26.0 - 34.0 pg   MCHC 31.4 30.0 - 36.0 g/dL   RDW 14.8 11.5 - 15.5 %   Platelets 202 150 - 400 K/uL   Neutrophils Relative % 61 %   Neutro  Abs 4.9 1.7 - 7.7 K/uL   Lymphocytes Relative 30 %   Lymphs Abs 2.4 0.7 - 4.0 K/uL   Monocytes Relative 7 %   Monocytes Absolute 0.5 0.1 - 1.0 K/uL   Eosinophils Relative 2 %   Eosinophils Absolute 0.2 0.0 - 0.7 K/uL   Basophils Relative 0 %   Basophils Absolute 0.0 0.0 - 0.1 K/uL  Troponin I     Status: None   Collection Time: 07/22/15  1:15 AM  Result Value Ref Range   Troponin I <0.03 <0.031 ng/mL    Comment:        NO INDICATION OF MYOCARDIAL INJURY.   Troponin I     Status: None   Collection Time: 07/22/15  4:45 AM  Result Value Ref Range   Troponin I <0.03 <0.031 ng/mL    Comment:        NO INDICATION OF MYOCARDIAL INJURY.   Vitamin B12     Status: None   Collection Time: 07/22/15 10:28 AM  Result Value Ref Range   Vitamin B-12 438 180 - 914 pg/mL    Comment: (NOTE) This assay is not validated for testing neonatal or myeloproliferative syndrome specimens for Vitamin B12 levels.   Folate     Status: None   Collection Time: 07/22/15 10:28 AM  Result Value Ref Range   Folate 12.0 >5.9 ng/mL  Iron and TIBC     Status: None   Collection Time: 07/22/15 10:28 AM  Result Value Ref Range   Iron 83 45 - 182 ug/dL   TIBC 295 250 - 450 ug/dL   Saturation Ratios 28 17.9 - 39.5 %   UIBC 212 ug/dL  Ferritin     Status: None   Collection Time: 07/22/15 10:28 AM  Result Value Ref Range   Ferritin 192 24 - 336 ng/mL  Reticulocytes     Status: None   Collection Time: 07/22/15 10:28 AM  Result Value Ref Range   Retic Ct Pct 0.8 0.4 - 3.1 %   RBC. 4.69 4.22 - 5.81 MIL/uL   Retic Count, Manual 37.5 19.0 - 186.0 K/uL  Glucose, capillary     Status: None   Collection Time: 07/22/15 12:05 PM  Result Value Ref Range  Glucose-Capillary 96 65 - 99 mg/dL   Comment 1 Notify RN    Comment 2 Document in Chart   Hemoglobin A1c     Status: Abnormal   Collection Time: 07/22/15  2:08 PM  Result Value Ref Range   Hgb A1c MFr Bld 8.4 (H) 4.8 - 5.6 %    Comment: (NOTE)          Pre-diabetes: 5.7 - 6.4         Diabetes: >6.4         Glycemic control for adults with diabetes: <7.0    Mean Plasma Glucose 194 mg/dL    Comment: (NOTE) Performed At: [facility] [address] [place], [place] [id number] Lindon Romp MD VO:[id number]   Glucose, capillary     Status: None   Collection Time: 07/22/15  4:06 PM  Result Value Ref Range   Glucose-Capillary 83 65 - 99 mg/dL   Comment 1 Notify RN    Comment 2 Document in Chart   Glucose, capillary     Status: Abnormal   Collection Time: 07/22/15  9:47 PM  Result Value Ref Range   Glucose-Capillary 116 (H) 65 - 99 mg/dL  Basic metabolic panel     Status: Abnormal   Collection Time: 07/23/15  4:13 AM  Result Value Ref Range   Sodium 140 135 - 145 mmol/L   Potassium 4.1 3.5 - 5.1 mmol/L   Chloride 106 101 - 111 mmol/L   CO2 27 22 - 32 mmol/L   Glucose, Bld 98 65 - 99 mg/dL   BUN 19 6 - 20 mg/dL   Creatinine, Ser 1.55 (H) 0.61 - 1.24 mg/dL   Calcium 9.5 8.9 - 10.3 mg/dL   GFR calc non Af Amer 51 (L) >60 mL/min   GFR calc Af Amer 60 (L) >60 mL/min    Comment: (NOTE) The eGFR has been calculated using the CKD EPI equation. This calculation has not been validated in all clinical situations. eGFR's persistently <60 mL/min signify possible Chronic Kidney Disease.    Anion gap 7 5 - 15  Glucose, capillary     Status: Abnormal   Collection Time: 07/23/15  6:25 AM  Result Value Ref Range   Glucose-Capillary 110 (H) 65 - 99 mg/dL     Lipid Panel     Component Value Date/Time   CHOL 155 02/29/2012 0320   TRIG 314* 02/29/2012 0320   HDL 30* 02/29/2012 0320   CHOLHDL 5.2 02/29/2012 0320   VLDL 63* 02/29/2012 0320   LDLCALC 62 02/29/2012 0320     Lab Results  Component Value Date   HGBA1C 8.4* 07/22/2015     Lab Results  Component Value Date   LDLCALC 62 02/29/2012   CREATININE 1.55* 07/23/2015     HPI :49 y.o. male with T2DM, HTN, OSA and chronic, intermittent chest pain transferred  from [facility] with chest pain associated with chest tightness, SOB and lightheadedness. He was admitted 3 years ago with chest pain. Cardiac enzymes were normal. Lexiscan Myoview negative for ischemia.   At work on [employer] patient developed some pain / tenderness in vein on right side of neck. He also had some lightheadedness. Last night around 9pm he developed left-sided chest pain. The pain did not radiate into [place], shoulders, arms or through to back. He had some mild nausea, no diaphoresis. Patient did have some associated tingling in his right lower extremity however. The pain persisted until 1:00am which time patient decided to go to  the emergency department after 3 sublingual NTG chest pain resolved. No history of GERD. Patient did not try anything at home for the chest pain prior to coming to the emergency [place]:    Chest pain with moderate risk of acute coronary syndrome. Chest pain score 5. He had a negative Myoview 3 years ago. Chest pain occurred at rest. Etiology? Cardiac not excluded, other considerations include GERD, musculoskeletal, anxiety as the patient recently lost his job.  Telemetry showed sinus rhythm overnight Cardiac enzymes 2 negative Serial EKG within normal limits -GI cocktail prn -outpatient cardiac evaluation, patient wanting to leave, pacing the room and the halls Very anxious and would like to go home as he recently lost his job Agrees to follow-up with PCP for an outpatient workup, 2-D echo outpatient exercise stress test   Diabetes mellitus type 2 in obese. Resume home medications, hemoglobin A1c 8.4  HTN , BP stable Not on any BP meds at home. .   Obstructive sleep apnea  AKI. Creatinine 1.76> 1.55, last one was 0.99 (3 years ago). He could have CKD vrs AKI.  -recheck am BMET  Obesity. Actively trying to lose weight.   Mild microcytic anemia. No overt GI blood loss.  -check iron studies.     Discharge Exam:    Blood  pressure 112/59, pulse 81, temperature 97.6 F (36.4 C), temperature source Oral, resp. rate 18, height 5' 9.5" (1.765 m), weight 128.9 kg (284 lb 2.8 oz), SpO2 98 %.   General: Pleasant obese male, Appears calm and comfortable  Eyes: PER, normal lids, irises & conjunctiva  ENT: grossly normal hearing, lips & tongue  Neck: no LAD, no masses  Cardiovascular: RRR, no murmurs. No LE edema.   Respiratory: Respirations even and unlabored. Normal respiratory effort. Lungs CTA bilaterally, no wheezes / rales .   Abdomen: soft, non-distended, non-tender, active bowel sounds. No obvious masses.   Skin: no rash seen on limited exam  Musculoskeletal: grossly normal tone BUE/BLE  Psychiatric: grossly normal mood and affect, speech fluent and appropriate  Neurologic: grossly non-focal.               Follow-up Information    Follow up with Garlan Fillers, MD. Schedule an appointment as soon as possible for a visit in 3 days.   Specialty:  Family Medicine   Contact information:   [address] [place] [address] [phone]       Signed: Reyne Dumas 07/23/2015, 10:23 AM        Time spent >45 mins

## 2015-07-23 NOTE — Discharge Summary (Signed)
Patient refused to wait for a wheelchair, patient able to walk unassisted without complications. Patient walked off the floor for discharge independently. Minerva Endsiffany N Neveyah Garzon

## 2015-10-22 ENCOUNTER — Encounter (HOSPITAL_COMMUNITY): Payer: Self-pay

## 2015-10-22 ENCOUNTER — Emergency Department (HOSPITAL_COMMUNITY)
Admission: EM | Admit: 2015-10-22 | Discharge: 2015-10-23 | Disposition: A | Discharge status: Home / Self Care | Payer: Managed Care, Other (non HMO) | Attending: Emergency Medicine | Admitting: Emergency Medicine

## 2015-10-22 ENCOUNTER — Emergency Department (HOSPITAL_COMMUNITY): Payer: Managed Care, Other (non HMO)

## 2015-10-22 DIAGNOSIS — M25551 Pain in right hip: Secondary | ICD-10-CM | POA: Diagnosis not present

## 2015-10-22 DIAGNOSIS — T391X2A Poisoning by 4-Aminophenol derivatives, intentional self-harm, initial encounter: Secondary | ICD-10-CM | POA: Insufficient documentation

## 2015-10-22 DIAGNOSIS — R1031 Right lower quadrant pain: Secondary | ICD-10-CM | POA: Diagnosis not present

## 2015-10-22 DIAGNOSIS — Z7982 Long term (current) use of aspirin: Secondary | ICD-10-CM | POA: Diagnosis not present

## 2015-10-22 DIAGNOSIS — Z8669 Personal history of other diseases of the nervous system and sense organs: Secondary | ICD-10-CM | POA: Diagnosis not present

## 2015-10-22 DIAGNOSIS — Y998 Other external cause status: Secondary | ICD-10-CM | POA: Diagnosis not present

## 2015-10-22 DIAGNOSIS — Z79899 Other long term (current) drug therapy: Secondary | ICD-10-CM | POA: Insufficient documentation

## 2015-10-22 DIAGNOSIS — Z7984 Long term (current) use of oral hypoglycemic drugs: Secondary | ICD-10-CM | POA: Diagnosis not present

## 2015-10-22 DIAGNOSIS — M79671 Pain in right foot: Secondary | ICD-10-CM | POA: Insufficient documentation

## 2015-10-22 DIAGNOSIS — I10 Essential (primary) hypertension: Secondary | ICD-10-CM | POA: Insufficient documentation

## 2015-10-22 DIAGNOSIS — Y9289 Other specified places as the place of occurrence of the external cause: Secondary | ICD-10-CM | POA: Diagnosis not present

## 2015-10-22 DIAGNOSIS — Y9389 Activity, other specified: Secondary | ICD-10-CM | POA: Diagnosis not present

## 2015-10-22 DIAGNOSIS — E119 Type 2 diabetes mellitus without complications: Secondary | ICD-10-CM | POA: Diagnosis not present

## 2015-10-22 DIAGNOSIS — E669 Obesity, unspecified: Secondary | ICD-10-CM | POA: Diagnosis not present

## 2015-10-22 LAB — CBC
HEMATOCRIT: 35 % — AB (ref 39.0–52.0)
Hemoglobin: 11 g/dL — ABNORMAL LOW (ref 13.0–17.0)
MCH: 25.6 pg — ABNORMAL LOW (ref 26.0–34.0)
MCHC: 31.4 g/dL (ref 30.0–36.0)
MCV: 81.6 fL (ref 78.0–100.0)
PLATELETS: 198 10*3/uL (ref 150–400)
RBC: 4.29 MIL/uL (ref 4.22–5.81)
RDW: 15.6 % — AB (ref 11.5–15.5)
WBC: 9.1 10*3/uL (ref 4.0–10.5)

## 2015-10-22 LAB — COMPREHENSIVE METABOLIC PANEL
ALT: 11 U/L — AB (ref 17–63)
AST: 15 U/L (ref 15–41)
Albumin: 3.5 g/dL (ref 3.5–5.0)
Alkaline Phosphatase: 61 U/L (ref 38–126)
Anion gap: 9 (ref 5–15)
BUN: 31 mg/dL — AB (ref 6–20)
CHLORIDE: 112 mmol/L — AB (ref 101–111)
CO2: 23 mmol/L (ref 22–32)
CREATININE: 1.7 mg/dL — AB (ref 0.61–1.24)
Calcium: 8.7 mg/dL — ABNORMAL LOW (ref 8.9–10.3)
GFR calc Af Amer: 53 mL/min — ABNORMAL LOW (ref >60)
GFR calc non Af Amer: 46 mL/min — ABNORMAL LOW (ref >60)
Glucose, Bld: 125 mg/dL — ABNORMAL HIGH (ref 65–99)
Potassium: 4.3 mmol/L (ref 3.5–5.1)
SODIUM: 144 mmol/L (ref 135–145)
Total Bilirubin: 0.2 mg/dL — ABNORMAL LOW (ref 0.3–1.2)
Total Protein: 6.7 g/dL (ref 6.5–8.1)

## 2015-10-22 LAB — ACETAMINOPHEN LEVEL: ACETAMINOPHEN (TYLENOL), SERUM: 25 ug/mL (ref 10–30)

## 2015-10-22 LAB — CBG MONITORING, ED: Glucose-Capillary: 106 mg/dL — ABNORMAL HIGH (ref 65–99)

## 2015-10-22 LAB — SALICYLATE LEVEL

## 2015-10-22 LAB — ETHANOL

## 2015-10-22 MED ORDER — SODIUM CHLORIDE 0.9 % IV BOLUS (SEPSIS)
1000.0000 mL | Freq: Once | INTRAVENOUS | Status: AC
  Administered 2015-10-22: 1000 mL via INTRAVENOUS

## 2015-10-22 NOTE — ED Notes (Signed)
Per EMS at 2015 took 10 tylenol pm 500mg /50mg . Hx of chronic pain. "stated that "he just wanted to go to sleep". VSS.
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
## 2015-10-23 LAB — ACETAMINOPHEN LEVEL: Acetaminophen (Tylenol), Serum: 14 ug/mL (ref 10–30)
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
## 2015-10-23 NOTE — ED Notes (Signed)
Pt. stated unable to give urine at this time. 

## 2015-12-30 ENCOUNTER — Encounter (HOSPITAL_COMMUNITY): Payer: Self-pay | Admitting: Emergency Medicine

## 2015-12-30 ENCOUNTER — Emergency Department (HOSPITAL_COMMUNITY): Payer: 59

## 2015-12-30 ENCOUNTER — Emergency Department (HOSPITAL_COMMUNITY)
Admission: EM | Admit: 2015-12-30 | Discharge: 2015-12-30 | Disposition: A | Discharge status: Home / Self Care | Payer: 59 | Attending: Emergency Medicine | Admitting: Emergency Medicine

## 2015-12-30 DIAGNOSIS — Z79899 Other long term (current) drug therapy: Secondary | ICD-10-CM | POA: Diagnosis not present

## 2015-12-30 DIAGNOSIS — Z7982 Long term (current) use of aspirin: Secondary | ICD-10-CM | POA: Diagnosis not present

## 2015-12-30 DIAGNOSIS — I10 Essential (primary) hypertension: Secondary | ICD-10-CM | POA: Insufficient documentation

## 2015-12-30 DIAGNOSIS — E119 Type 2 diabetes mellitus without complications: Secondary | ICD-10-CM | POA: Insufficient documentation

## 2015-12-30 DIAGNOSIS — Z8669 Personal history of other diseases of the nervous system and sense organs: Secondary | ICD-10-CM | POA: Diagnosis not present

## 2015-12-30 DIAGNOSIS — R42 Dizziness and giddiness: Secondary | ICD-10-CM | POA: Diagnosis not present

## 2015-12-30 DIAGNOSIS — R51 Headache: Secondary | ICD-10-CM | POA: Insufficient documentation

## 2015-12-30 DIAGNOSIS — R519 Headache, unspecified: Secondary | ICD-10-CM

## 2015-12-30 DIAGNOSIS — Z7984 Long term (current) use of oral hypoglycemic drugs: Secondary | ICD-10-CM | POA: Diagnosis not present

## 2015-12-30 LAB — URINALYSIS, ROUTINE W REFLEX MICROSCOPIC
BILIRUBIN URINE: NEGATIVE
GLUCOSE, UA: NEGATIVE mg/dL
HGB URINE DIPSTICK: NEGATIVE
Ketones, ur: NEGATIVE mg/dL
Leukocytes, UA: NEGATIVE
Nitrite: NEGATIVE
PROTEIN: NEGATIVE mg/dL
Specific Gravity, Urine: 1.017 (ref 1.005–1.030)
pH: 5.5 (ref 5.0–8.0)

## 2015-12-30 LAB — CBC
HEMATOCRIT: 37.2 % — AB (ref 39.0–52.0)
HEMOGLOBIN: 11.6 g/dL — AB (ref 13.0–17.0)
MCH: 24.8 pg — ABNORMAL LOW (ref 26.0–34.0)
MCHC: 31.2 g/dL (ref 30.0–36.0)
MCV: 79.7 fL (ref 78.0–100.0)
Platelets: 250 10*3/uL (ref 150–400)
RBC: 4.67 MIL/uL (ref 4.22–5.81)
RDW: 14.5 % (ref 11.5–15.5)
WBC: 11.6 10*3/uL — AB (ref 4.0–10.5)

## 2015-12-30 LAB — RAPID URINE DRUG SCREEN, HOSP PERFORMED
AMPHETAMINES: NOT DETECTED
Barbiturates: NOT DETECTED
Benzodiazepines: NOT DETECTED
Cocaine: NOT DETECTED
OPIATES: NOT DETECTED
TETRAHYDROCANNABINOL: NOT DETECTED

## 2015-12-30 LAB — BASIC METABOLIC PANEL
Anion gap: 6 (ref 5–15)
BUN: 32 mg/dL — AB (ref 6–20)
CO2: 22 mmol/L (ref 22–32)
CREATININE: 1.67 mg/dL — AB (ref 0.61–1.24)
Calcium: 9.4 mg/dL (ref 8.9–10.3)
Chloride: 111 mmol/L (ref 101–111)
GFR calc Af Amer: 54 mL/min — ABNORMAL LOW (ref >60)
GFR, EST NON AFRICAN AMERICAN: 47 mL/min — AB (ref >60)
GLUCOSE: 104 mg/dL — AB (ref 65–99)
POTASSIUM: 5.1 mmol/L (ref 3.5–5.1)
SODIUM: 139 mmol/L (ref 135–145)

## 2015-12-30 LAB — ETHANOL

## 2015-12-30 MED ORDER — DIPHENHYDRAMINE HCL 50 MG/ML IJ SOLN
25.0000 mg | Freq: Once | INTRAMUSCULAR | Status: AC
  Administered 2015-12-30: 25 mg via INTRAVENOUS
  Filled 2015-12-30: qty 1

## 2015-12-30 MED ORDER — METOCLOPRAMIDE HCL 5 MG/ML IJ SOLN
10.0000 mg | Freq: Once | INTRAMUSCULAR | Status: AC
  Administered 2015-12-30: 10 mg via INTRAVENOUS
  Filled 2015-12-30: qty 2

## 2015-12-30 MED ORDER — SODIUM CHLORIDE 0.9 % IV BOLUS (SEPSIS)
1000.0000 mL | Freq: Once | INTRAVENOUS | Status: AC
  Administered 2015-12-30: 1000 mL via INTRAVENOUS

## 2015-12-30 NOTE — Discharge Instructions (Signed)
Return to the ED with any concerns including vomiting, changes in vision or speech, weakness of arms or legs, decreased level of alertness/lethargy, or any other alarming symptoms °

## 2015-12-30 NOTE — ED Notes (Addendum)
Pt arrives from home via [facility] reporting sudden onset HA with dizziness around 1545 after taking tramadol.  EMS reports being unable to find the bottle of tramadol. Pt appears lethargic, slow to follow commands.  Dr. Karma GanjaLinker made aware.

## 2015-12-30 NOTE — ED Notes (Signed)
Dr. Linker MD at bedside. 

## 2015-12-30 NOTE — ED Notes (Signed)
Ambulated pt in hallway with pts personal cane. Pt gait steady.

## 2015-12-30 NOTE — ED Provider Notes (Signed)
CSN: [id number]     Arrival date & time 12/30/15  1736 History   First MD Initiated Contact with Patient 12/30/15 1805     Chief Complaint  Patient presents with  . Dizziness  . Headache     (Consider location/radiation/quality/duration/timing/severity/associated sxs/prior Treatment) HPI  Pt presenting with c/o headache.  He states he began to have a posterior headache this afternoon.  He states headache is throbbing and gradual in onset.  No vomiting, no changes in vision or speech.  No fever/chills.  He took a tramadol which he states he has never taken in the past- this made him feel drowsy.  He has no focal weakness, but generally feels sleepy and tired.  There are no other associated systemic symptoms, there are no other alleviating or modifying factors.   Past Medical History  Diagnosis Date  . Diabetes mellitus   . Hypertension   . Obstructive sleep apnea   . Complication of anesthesia     difficult waking    Past Surgical History  Procedure Laterality Date  . Cholecystectomy     Family History  Problem Relation Age of Onset  . Heart attack Father   . Heart attack Brother   . Diabetes Mother   . Hypertension Mother   . Hypertension Father    Social History  Substance Use Topics  . Smoking status: Never Smoker   . Smokeless tobacco: Never Used  . Alcohol Use: No     Comment: 1 glass wine per week/biweekly    Review of Systems  ROS reviewed and all otherwise negative except for mentioned in HPI    Allergies  Review of patient's allergies indicates no known allergies.  Home Medications   Prior to Admission medications   Medication Sig Start Date End Date Taking? Authorizing Provider  aspirin EC 81 MG tablet Take 81 mg by mouth daily.   Yes Historical Provider, MD  cholecalciferol (VITAMIN D) 1000 units tablet Take 1,000 Units by mouth daily.   Yes Historical Provider, MD  FLUoxetine (PROZAC) 10 MG capsule Take 10 mg by mouth daily.   Yes Historical  Provider, MD  glipiZIDE (GLUCOTROL) 5 MG tablet Take 5 mg by mouth daily before breakfast.   Yes Historical Provider, MD  lisinopril (PRINIVIL,ZESTRIL) 20 MG tablet Take 20 mg by mouth daily.   Yes Historical Provider, MD  Omega-3 Fatty Acids (FISH OIL PO) Take by mouth.   Yes Historical Provider, MD  traMADol (ULTRAM) 50 MG tablet Take 50 mg by mouth every 6 (six) hours as needed for moderate pain.   Yes Historical Provider, MD  vitamin C (ASCORBIC ACID) 500 MG tablet Take 500 mg by mouth daily.   Yes Historical Provider, MD  zinc sulfate 220 MG capsule Take 220 mg by mouth daily.   Yes Historical Provider, MD   BP 108/62 mmHg  Pulse 64  Temp(Src) 98.5 F (36.9 C) (Oral)  Resp 10  Ht 5' 9.5" (1.765 m)  Wt 262 lb (118.842 kg)  BMI 38.15 kg/m2  SpO2 97%  Vitals reviewed Physical Exam  Physical Examination: General appearance - alert, tired appearing, and in no distress Mental status - alert, oriented to person, place, and time Eyes - pupils equal and reactive, EOMI Mouth - mucous membranes moist, pharynx normal without lesions  Neck- no nuchal rigidity Chest - clear to auscultation, no wheezes, rales or rhonchi, symmetric air entry Heart - normal rate, regular rhythm, normal S1, S2, no murmurs, rubs, clicks or gallops Abdomen -  soft, nontender, nondistended, no masses or organomegaly Neurological - alert, oriented, normal speech, cranial nerves 2-12 tested and intact, strength 5/5 in extremities x 4, no pronator drift, sensation intact Extremities - peripheral pulses normal, no pedal edema, no clubbing or cyanosis Skin - normal coloration and turgor, no rashes  ED Course  Procedures (including critical care time) Labs Review Labs Reviewed  CBC - Abnormal; Notable for the following:    WBC 11.6 (*)    Hemoglobin 11.6 (*)    HCT 37.2 (*)    MCH 24.8 (*)    All other components within normal limits  BASIC METABOLIC PANEL - Abnormal; Notable for the following:    Glucose, Bld 104  (*)    BUN 32 (*)    Creatinine, Ser 1.67 (*)    GFR calc non Af Amer 47 (*)    GFR calc Af Amer 54 (*)    All other components within normal limits  URINALYSIS, ROUTINE W REFLEX MICROSCOPIC (NOT AT [facility])  URINE RAPID DRUG SCREEN, HOSP PERFORMED  ETHANOL    Imaging Review Ct Head Wo Contrast  12/30/2015  CLINICAL DATA:  Headache, dizziness starting 3:45 p.m., lethargy after taking tramadol EXAM: CT HEAD WITHOUT CONTRAST TECHNIQUE: Contiguous axial images were obtained from the base of the skull through the vertex without intravenous contrast. COMPARISON:  None. FINDINGS: No intracranial hemorrhage, mass effect or midline shift. No acute cortical infarction. No mass lesion is noted on this unenhanced scan. No hydrocephalus. The gray and white-matter differentiation is preserved. No skull fracture is noted. No vascular calcifications are noted. Paranasal sinuses and mastoid air cells are unremarkable. IMPRESSION: 1. No acute intracranial abnormality. Electronically Signed   By: Natasha Mead M.D.   On: 12/30/2015 20:14   I have personally reviewed and evaluated these images and lab results as part of my medical decision-making.   EKG Interpretation   Date/Time:  Tuesday Dec 30 2015 17:59:41 EDT Ventricular Rate:  73 PR Interval:  179 QRS Duration: 95 QT Interval:  361 QTC Calculation: 398 R Axis:   42 Text Interpretation:  Sinus rhythm ST elev, probable normal early repol  pattern No significant change since last tracing Confirmed by [facility]  MD,  Robson Trickey [phone]) on 12/30/2015 6:47:17 PM      MDM   Final diagnoses:  Nonintractable episodic headache, unspecified headache type    Pt presenting with c/o headache- he states he took a tramadol which he has never taken before and he began to feel very drowsy.  No focal findings on neurologic exam.  No sudden onset to suggest SAH, no fever or nuchal rigidity to suggest meningintis.  Head CT negative.  Pt feels much improved after migraine  cocktail.  Discharged with strict return precautions.  Pt agreeable with plan.    Jerelyn Scott, MD 12/31/15 [phone]

## 2016-01-31 ENCOUNTER — Encounter (HOSPITAL_BASED_OUTPATIENT_CLINIC_OR_DEPARTMENT_OTHER): Payer: Self-pay | Admitting: Emergency Medicine

## 2016-01-31 ENCOUNTER — Emergency Department (HOSPITAL_BASED_OUTPATIENT_CLINIC_OR_DEPARTMENT_OTHER)
Admission: EM | Admit: 2016-01-31 | Discharge: 2016-02-01 | Disposition: A | Discharge status: Home / Self Care | Payer: Managed Care, Other (non HMO) | Attending: Emergency Medicine | Admitting: Emergency Medicine

## 2016-01-31 DIAGNOSIS — T384X5A Adverse effect of oral contraceptives, initial encounter: Secondary | ICD-10-CM | POA: Diagnosis not present

## 2016-01-31 DIAGNOSIS — Z7982 Long term (current) use of aspirin: Secondary | ICD-10-CM | POA: Diagnosis not present

## 2016-01-31 DIAGNOSIS — R51 Headache: Secondary | ICD-10-CM

## 2016-01-31 DIAGNOSIS — E119 Type 2 diabetes mellitus without complications: Secondary | ICD-10-CM | POA: Insufficient documentation

## 2016-01-31 DIAGNOSIS — Z7984 Long term (current) use of oral hypoglycemic drugs: Secondary | ICD-10-CM | POA: Insufficient documentation

## 2016-01-31 DIAGNOSIS — Z79899 Other long term (current) drug therapy: Secondary | ICD-10-CM | POA: Insufficient documentation

## 2016-01-31 DIAGNOSIS — R519 Headache, unspecified: Secondary | ICD-10-CM

## 2016-01-31 DIAGNOSIS — G4733 Obstructive sleep apnea (adult) (pediatric): Secondary | ICD-10-CM | POA: Insufficient documentation

## 2016-01-31 DIAGNOSIS — T3995XA Adverse effect of unspecified nonopioid analgesic, antipyretic and antirheumatic, initial encounter: Secondary | ICD-10-CM

## 2016-01-31 DIAGNOSIS — G4440 Drug-induced headache, not elsewhere classified, not intractable: Secondary | ICD-10-CM | POA: Diagnosis not present

## 2016-01-31 DIAGNOSIS — I10 Essential (primary) hypertension: Secondary | ICD-10-CM | POA: Diagnosis not present

## 2016-01-31 DIAGNOSIS — G4730 Sleep apnea, unspecified: Secondary | ICD-10-CM

## 2016-01-31 MED ORDER — SODIUM CHLORIDE 0.9 % IV BOLUS (SEPSIS)
1000.0000 mL | Freq: Once | INTRAVENOUS | Status: AC
  Administered 2016-02-01: 1000 mL via INTRAVENOUS

## 2016-01-31 MED ORDER — METOCLOPRAMIDE HCL 5 MG/ML IJ SOLN
10.0000 mg | Freq: Once | INTRAMUSCULAR | Status: AC
  Administered 2016-02-01: 10 mg via INTRAVENOUS
  Filled 2016-01-31: qty 2

## 2016-01-31 NOTE — ED Provider Notes (Signed)
CSN: [id number]     Arrival date & time 01/31/16  1930 History  By signing my name below, I, Samuel Simon, attest that this documentation has been prepared under the direction and in the presence of Blessed Cotham, MD . Electronically Signed: Marisue Simon, Scribe. 01/31/2016. 11:52 PM.   Chief Complaint  Patient presents with  . Headache    Patient is a 50 y.o. male presenting with migraines. The history is provided by the patient and a relative. No language interpreter was used.  Migraine This is a chronic problem. The current episode started more than 1 week ago. The problem occurs constantly. Associated symptoms include headaches. Pertinent negatives include no chest pain, no abdominal pain and no shortness of breath. Nothing aggravates the symptoms. Nothing relieves the symptoms. Treatments tried: vicodin   The treatment provided no relief.   HPI Comments:  Samuel Simon is a 50 y.o. male with PMHx of DM, HTN and migraine who presents to the Emergency Department via EMS complaining of chronic generalized headache. Family reports headache radiates down his neck. Family states pt was seen at [facility] 2 times this week and has been seen last month at [facility] for similar symptoms; family states pt was given medication and saline without relief. Pt has a neurologist for back problems but has not been able to get an appointment for headache. He has taken Hydrocodone as prescribed by PCP without relief, none today. No other symptoms or complaints at this time.  Per chart review, pt had normal head CT 12/30/2015. Pt is also seen by pain management.  Past Medical History  Diagnosis Date  . Diabetes mellitus   . Hypertension   . Obstructive sleep apnea   . Complication of anesthesia     difficult waking    Past Surgical History  Procedure Laterality Date  . Cholecystectomy     Family History  Problem Relation Age of Onset  . Heart attack Father   . Heart attack  Brother   . Diabetes Mother   . Hypertension Mother   . Hypertension Father    Social History  Substance Use Topics  . Smoking status: Never Smoker   . Smokeless tobacco: Never Used  . Alcohol Use: No     Comment: 1 glass wine per week/biweekly    Review of Systems  Constitutional: Negative for fever.  Eyes: Positive for photophobia. Negative for redness and visual disturbance.  Respiratory: Negative for shortness of breath.   Cardiovascular: Negative for chest pain.  Gastrointestinal: Negative for abdominal pain.  Musculoskeletal: Negative for neck pain and neck stiffness.  Neurological: Positive for headaches. Negative for dizziness, facial asymmetry, speech difficulty, weakness, light-headedness and numbness.  All other systems reviewed and are negative.   Allergies  Review of patient's allergies indicates no known allergies.  Home Medications   Prior to Admission medications   Medication Sig Start Date End Date Taking? Authorizing Provider  aspirin EC 81 MG tablet Take 81 mg by mouth daily.    Historical Provider, MD  cholecalciferol (VITAMIN D) 1000 units tablet Take 1,000 Units by mouth daily.    Historical Provider, MD  FLUoxetine (PROZAC) 10 MG capsule Take 10 mg by mouth daily.    Historical Provider, MD  glipiZIDE (GLUCOTROL) 5 MG tablet Take 5 mg by mouth daily before breakfast.    Historical Provider, MD  lisinopril (PRINIVIL,ZESTRIL) 20 MG tablet Take 20 mg by mouth daily.    Historical Provider, MD  Omega-3 Fatty Acids (FISH  OIL PO) Take by mouth.    Historical Provider, MD  traMADol (ULTRAM) 50 MG tablet Take 50 mg by mouth every 6 (six) hours as needed for moderate pain.    Historical Provider, MD  vitamin C (ASCORBIC ACID) 500 MG tablet Take 500 mg by mouth daily.    Historical Provider, MD  zinc sulfate 220 MG capsule Take 220 mg by mouth daily.    Historical Provider, MD   BP 124/84 mmHg  Pulse 66  Temp(Src) 98.6 F (37 C) (Oral)  Resp 18  Ht 5' 9.5"  (1.765 m)  Wt 263 lb (119.296 kg)  BMI 38.29 kg/m2  SpO2 93%   Physical Exam  Constitutional: He appears well-developed.  HENT:  Head: Normocephalic and atraumatic.  Mouth/Throat: Oropharynx is clear and moist. No oropharyngeal exudate.  Eyes: Conjunctivae and EOM are normal. Pupils are equal, round, and reactive to light.  Symmetric, pinpoint pupils  Neck: Normal range of motion. Neck supple. Carotid bruit is not present. No tracheal deviation present.  Trachea midline  Cardiovascular: Normal rate, regular rhythm and intact distal pulses.   Pulmonary/Chest: Effort normal and breath sounds normal. No stridor. No respiratory distress. He has no wheezes. He has no rales.  Abdominal: Soft. Bowel sounds are normal. There is no tenderness. There is no rebound and no guarding.  Musculoskeletal: Normal range of motion.  Neurological: He is alert. He has normal reflexes. No cranial nerve deficit.  Skin: Skin is warm and dry.  Psychiatric: He has a normal mood and affect.  Nursing note and vitals reviewed.   ED Course  Procedures  DIAGNOSTIC STUDIES:  Oxygen Saturation is 94% on RA, adequate by my interpretation.    COORDINATION OF CARE:  11:31 PM Recommended Naproxen for OTC relief. Will administer fluids and Reglan. Discussed treatment plan with pt at bedside and pt agreed to plan.  Labs Review Labs Reviewed - No data to display  Imaging Review No results found. I have personally reviewed and evaluated these images and lab results as part of my medical decision-making.   EKG Interpretation None      MDM   Final diagnoses:  None   Filed Vitals:   01/31/16 2304 01/31/16 2317  BP: 125/69 124/84  Pulse: 75 66  Temp:    Resp: 18 18    Results for orders placed or performed during the hospital encounter of 12/30/15  CBC  Result Value Ref Range   WBC 11.6 (H) 4.0 - 10.5 K/uL   RBC 4.67 4.22 - 5.81 MIL/uL   Hemoglobin 11.6 (L) 13.0 - 17.0 g/dL   HCT 21.337.2 (L) 08.639.0 -  52.0 %   MCV 79.7 78.0 - 100.0 fL   MCH 24.8 (L) 26.0 - 34.0 pg   MCHC 31.2 30.0 - 36.0 g/dL   RDW 57.814.5 46.911.5 - 62.915.5 %   Platelets 250 150 - 400 K/uL  Basic metabolic panel  Result Value Ref Range   Sodium 139 135 - 145 mmol/L   Potassium 5.1 3.5 - 5.1 mmol/L   Chloride 111 101 - 111 mmol/L   CO2 22 22 - 32 mmol/L   Glucose, Bld 104 (H) 65 - 99 mg/dL   BUN 32 (H) 6 - 20 mg/dL   Creatinine, Ser 5.281.67 (H) 0.61 - 1.24 mg/dL   Calcium 9.4 8.9 - 41.310.3 mg/dL   GFR calc non Af Amer 47 (L) >60 mL/min   GFR calc Af Amer 54 (L) >60 mL/min   Anion gap 6 5 -  15  Urinalysis, Routine w reflex microscopic (not at [facility])  Result Value Ref Range   Color, Urine YELLOW YELLOW   APPearance CLEAR CLEAR   Specific Gravity, Urine 1.017 1.005 - 1.030   pH 5.5 5.0 - 8.0   Glucose, UA NEGATIVE NEGATIVE mg/dL   Hgb urine dipstick NEGATIVE NEGATIVE   Bilirubin Urine NEGATIVE NEGATIVE   Ketones, ur NEGATIVE NEGATIVE mg/dL   Protein, ur NEGATIVE NEGATIVE mg/dL   Nitrite NEGATIVE NEGATIVE   Leukocytes, UA NEGATIVE NEGATIVE  Urine rapid drug screen (hosp performed)  Result Value Ref Range   Opiates NONE DETECTED NONE DETECTED   Cocaine NONE DETECTED NONE DETECTED   Benzodiazepines NONE DETECTED NONE DETECTED   Amphetamines NONE DETECTED NONE DETECTED   Tetrahydrocannabinol NONE DETECTED NONE DETECTED   Barbiturates NONE DETECTED NONE DETECTED  Ethanol  Result Value Ref Range   Alcohol, Ethyl (B) <5 <5 mg/dL   No results found.  Medications  sodium chloride 0.9 % bolus 1,000 mL (0 mLs Intravenous Stopped 02/01/16 0133)  metoCLOPramide (REGLAN) injection 10 mg (10 mg Intravenous Given 02/01/16 0017)   Well appearing CN 2-12 intact.  Very recent normal CT.  No indication for repeat imaging nor LP at this time.    Sleeping soundly in the ED for the rest of the visit  Patient arouses easily to verbal stimuli but I do not feel it is in the patient's best interest with his sleep apnea to give him more  sedating medications. I have discussed the fact that the patient should abstain from narcotics as these can cause rebound analgesia headaches and also exacerbate his sleep apnea.  Moreover,  Patient is not wearing his CPAP as prescribed and this can lead to fatigue, HTN excessive daytime sleepiness.  Have recommended patient follow up with neurology (family states he sees a neurologist already) for his chronic headaches to discuss suppressive medication.  Strict return precautions given  I personally performed the services described in this documentation, which was scribed in my presence. The recorded information has been reviewed and is accurate.      Cy BlamerApril Samuel Mcpeek, MD 02/01/16 [phone]

## 2016-01-31 NOTE — ED Notes (Signed)
Per ems: The patient has hx of Migraine headaches. Took all his meds this am. Reports that that his pain returned laster today. Neuro intact

## 2016-02-01 ENCOUNTER — Encounter (HOSPITAL_BASED_OUTPATIENT_CLINIC_OR_DEPARTMENT_OTHER): Payer: Self-pay | Admitting: Emergency Medicine

## 2016-02-01 MED ORDER — NAPROXEN 500 MG PO TABS: 500.0000 mg | ORAL_TABLET | Freq: Two times a day (BID) | ORAL | Status: DC

## 2016-02-01 MED ORDER — NAPROXEN 250 MG PO TABS: 500.0000 mg | ORAL_TABLET | Freq: Once | ORAL | Status: DC

## 2016-02-09 ENCOUNTER — Encounter (HOSPITAL_BASED_OUTPATIENT_CLINIC_OR_DEPARTMENT_OTHER): Payer: Self-pay | Admitting: *Deleted

## 2016-02-09 ENCOUNTER — Emergency Department (HOSPITAL_BASED_OUTPATIENT_CLINIC_OR_DEPARTMENT_OTHER)
Admission: EM | Admit: 2016-02-09 | Discharge: 2016-02-09 | Disposition: A | Discharge status: Home / Self Care | Payer: Managed Care, Other (non HMO) | Attending: Emergency Medicine | Admitting: Emergency Medicine

## 2016-02-09 DIAGNOSIS — Z79899 Other long term (current) drug therapy: Secondary | ICD-10-CM | POA: Diagnosis not present

## 2016-02-09 DIAGNOSIS — Z7982 Long term (current) use of aspirin: Secondary | ICD-10-CM | POA: Diagnosis not present

## 2016-02-09 DIAGNOSIS — G43909 Migraine, unspecified, not intractable, without status migrainosus: Secondary | ICD-10-CM | POA: Diagnosis present

## 2016-02-09 DIAGNOSIS — Z7984 Long term (current) use of oral hypoglycemic drugs: Secondary | ICD-10-CM | POA: Insufficient documentation

## 2016-02-09 DIAGNOSIS — I10 Essential (primary) hypertension: Secondary | ICD-10-CM | POA: Insufficient documentation

## 2016-02-09 DIAGNOSIS — E119 Type 2 diabetes mellitus without complications: Secondary | ICD-10-CM | POA: Diagnosis not present

## 2016-02-09 HISTORY — DX: Dorsalgia, unspecified: M54.9

## 2016-02-09 HISTORY — DX: Headache: R51

## 2016-02-09 HISTORY — DX: Anxiety disorder, unspecified: F41.9

## 2016-02-09 HISTORY — DX: Other chronic pain: G89.29

## 2016-02-09 HISTORY — DX: Headache, unspecified: R51.9

## 2016-02-09 LAB — CBC WITH DIFFERENTIAL/PLATELET
BASOS ABS: 0 10*3/uL (ref 0.0–0.1)
BASOS PCT: 0 %
EOS ABS: 0.1 10*3/uL (ref 0.0–0.7)
Eosinophils Relative: 1 %
HCT: 39.3 % (ref 39.0–52.0)
HEMOGLOBIN: 12.6 g/dL — AB (ref 13.0–17.0)
Lymphocytes Relative: 31 %
Lymphs Abs: 4.2 10*3/uL — ABNORMAL HIGH (ref 0.7–4.0)
MCH: 25 pg — ABNORMAL LOW (ref 26.0–34.0)
MCHC: 32.1 g/dL (ref 30.0–36.0)
MCV: 78.1 fL (ref 78.0–100.0)
MONO ABS: 1.1 10*3/uL — AB (ref 0.1–1.0)
MONOS PCT: 8 %
NEUTROS ABS: 8.3 10*3/uL — AB (ref 1.7–7.7)
NEUTROS PCT: 60 %
Platelets: 226 10*3/uL (ref 150–400)
RBC: 5.03 MIL/uL (ref 4.22–5.81)
RDW: 14.8 % (ref 11.5–15.5)
WBC: 13.7 10*3/uL — ABNORMAL HIGH (ref 4.0–10.5)

## 2016-02-09 LAB — BASIC METABOLIC PANEL
ANION GAP: 8 (ref 5–15)
BUN: 29 mg/dL — ABNORMAL HIGH (ref 6–20)
CALCIUM: 9.2 mg/dL (ref 8.9–10.3)
CO2: 26 mmol/L (ref 22–32)
CREATININE: 1.58 mg/dL — AB (ref 0.61–1.24)
Chloride: 107 mmol/L (ref 101–111)
GFR, EST AFRICAN AMERICAN: 58 mL/min — AB (ref >60)
GFR, EST NON AFRICAN AMERICAN: 50 mL/min — AB (ref >60)
Glucose, Bld: 79 mg/dL (ref 65–99)
Potassium: 3.6 mmol/L (ref 3.5–5.1)
Sodium: 141 mmol/L (ref 135–145)

## 2016-02-09 MED ORDER — METOCLOPRAMIDE HCL 5 MG/ML IJ SOLN
10.0000 mg | Freq: Once | INTRAMUSCULAR | Status: AC
  Administered 2016-02-09: 10 mg via INTRAVENOUS
  Filled 2016-02-09: qty 2

## 2016-02-09 MED ORDER — SODIUM CHLORIDE 0.9 % IV BOLUS (SEPSIS)
1000.0000 mL | Freq: Once | INTRAVENOUS | Status: AC
  Administered 2016-02-09: 1000 mL via INTRAVENOUS

## 2016-02-09 MED ORDER — DEXAMETHASONE SODIUM PHOSPHATE 10 MG/ML IJ SOLN
10.0000 mg | Freq: Once | INTRAMUSCULAR | Status: AC
  Administered 2016-02-09: 10 mg via INTRAVENOUS
  Filled 2016-02-09: qty 1

## 2016-02-09 MED ORDER — DIPHENHYDRAMINE HCL 50 MG/ML IJ SOLN
25.0000 mg | Freq: Once | INTRAMUSCULAR | Status: AC
Start: 2016-02-09 — End: 2016-02-09
  Administered 2016-02-09: 25 mg via INTRAVENOUS
  Filled 2016-02-09: qty 1

## 2016-02-09 MED ORDER — KETOROLAC TROMETHAMINE 30 MG/ML IJ SOLN
30.0000 mg | Freq: Once | INTRAMUSCULAR | Status: AC
  Administered 2016-02-09: 30 mg via INTRAVENOUS
  Filled 2016-02-09: qty 1

## 2016-02-09 NOTE — Discharge Instructions (Signed)
Follow-up with your neurologist if your headaches persist.   Migraine Headache A migraine headache is an intense, throbbing pain on one or both sides of your head. A migraine can last for 30 minutes to several hours. CAUSES  The exact cause of a migraine headache is not always known. However, a migraine may be caused when nerves in the brain become irritated and release chemicals that cause inflammation. This causes pain. Certain things may also trigger migraines, such as:  Alcohol.  Smoking.  Stress.  Menstruation.  Aged cheeses.  Foods or drinks that contain nitrates, glutamate, aspartame, or tyramine.  Lack of sleep.  Chocolate.  Caffeine.  Hunger.  Physical exertion.  Fatigue.  Medicines used to treat chest pain (nitroglycerine), birth control pills, estrogen, and some blood pressure medicines. SIGNS AND SYMPTOMS  Pain on one or both sides of your head.  Pulsating or throbbing pain.  Severe pain that prevents daily activities.  Pain that is aggravated by any physical activity.  Nausea, vomiting, or both.  Dizziness.  Pain with exposure to bright lights, loud noises, or activity.  General sensitivity to bright lights, loud noises, or smells. Before you get a migraine, you may get warning signs that a migraine is coming (aura). An aura may include:  Seeing flashing lights.  Seeing bright spots, halos, or zigzag lines.  Having tunnel vision or blurred vision.  Having feelings of numbness or tingling.  Having trouble talking.  Having muscle weakness. DIAGNOSIS  A migraine headache is often diagnosed based on:  Symptoms.  Physical exam.  A CT scan or MRI of your head. These imaging tests cannot diagnose migraines, but they can help rule out other causes of headaches. TREATMENT Medicines may be given for pain and nausea. Medicines can also be given to help prevent recurrent migraines.  HOME CARE INSTRUCTIONS  Only take over-the-counter or  prescription medicines for pain or discomfort as directed by your health care provider. The use of long-term narcotics is not recommended.  Lie down in a dark, quiet room when you have a migraine.  Keep a journal to find out what may trigger your migraine headaches. For example, write down:  What you eat and drink.  How much sleep you get.  Any change to your diet or medicines.  Limit alcohol consumption.  Quit smoking if you smoke.  Get 7-9 hours of sleep, or as recommended by your health care provider.  Limit stress.  Keep lights dim if bright lights bother you and make your migraines worse. SEEK IMMEDIATE MEDICAL CARE IF:   Your migraine becomes severe.  You have a fever.  You have a stiff neck.  You have vision loss.  You have muscular weakness or loss of muscle control.  You start losing your balance or have trouble walking.  You feel faint or pass out.  You have severe symptoms that are different from your first symptoms. MAKE SURE YOU:   Understand these instructions.  Will watch your condition.  Will get help right away if you are not doing well or get worse.   This information is not intended to replace advice given to you by your health care provider. Make sure you discuss any questions you have with your health care provider.   Document Released: 07/26/2005 Document Revised: 08/16/2014 Document Reviewed: 04/02/2013 Elsevier Interactive Patient Education [employer]2016 [employer]

## 2016-02-09 NOTE — ED Notes (Signed)
Patient states he has had a headache for the last month.  Has been seen here for the same.  States the headache never goes away, the pain intensifies. Pain is associated with nausea and pain radiation into the neck.  Pain has been the same for the last month.

## 2016-02-09 NOTE — ED Provider Notes (Signed)
CSN: [id number]     Arrival date & time 02/09/16  [id number] History   First MD Initiated Contact with Patient 02/09/16 [phone]     Chief Complaint  Patient presents with  . Migraine     (Consider location/radiation/quality/duration/timing/severity/associated sxs/prior Treatment) HPI Comments: Patient is a 50 year old male with history of obesity, diabetes, chronic back pain, anxiety, and migraine headaches. He presents for evaluation of persistent headache for one month. He has been seen here and [facility] with similar complaints. He has been given multiple medications however nothing seems to be helping. He reports his headache as feeling like a fly prescription wrapped around his head. His pain is worse with bright light. He reports associated nausea but no vomiting.  He does have a history of migraine headaches and is followed by a neurologist in [facility]. He was recently started on a max, gabapentin, and prednisone, however this is not helping. He has no fevers or chills. He denies any stiff neck.  Patient is a [age] y.o. male presenting with migraines. The history is provided by the patient.  Migraine This is a chronic problem. Episode onset: One month ago. The problem occurs constantly. Associated symptoms include headaches. Pertinent negatives include no chest pain, no abdominal pain and no shortness of breath. Nothing aggravates the symptoms. Nothing relieves the symptoms. He has tried nothing for the symptoms. The treatment provided no relief.    Past Medical History  Diagnosis Date  . Diabetes mellitus   . Obstructive sleep apnea   . Complication of anesthesia     difficult waking   . Generalized headaches   . Hypertension     Denies history of htn.   . Anxiety   . Back pain, chronic    Past Surgical History  Procedure Laterality Date  . Cholecystectomy     Family History  Problem Relation Age of Onset  . Heart attack Father   . Heart attack Brother   . Diabetes  Mother   . Hypertension Mother   . Hypertension Father    Social History  Substance Use Topics  . Smoking status: Never Smoker   . Smokeless tobacco: Never Used  . Alcohol Use: No     Comment: 1 glass wine per week/biweekly    Review of Systems  Respiratory: Negative for shortness of breath.   Cardiovascular: Negative for chest pain.  Gastrointestinal: Negative for abdominal pain.  Neurological: Positive for headaches.  All other systems reviewed and are negative.     Allergies  Tramadol  Home Medications   Prior to Admission medications   Medication Sig Start Date End Date Taking? Authorizing Provider  aspirin EC 81 MG tablet Take 81 mg by mouth daily.   Yes Historical Provider, MD  cholecalciferol (VITAMIN D) 1000 units tablet Take 1,000 Units by mouth daily.   Yes Historical Provider, MD  DULoxetine (CYMBALTA) 60 MG capsule Take 60 mg by mouth daily.   Yes Historical Provider, MD  gabapentin (NEURONTIN) 300 MG capsule Take 300 mg by mouth 2 (two) times daily. Dosage will increase to TID on February 14, 2016   Yes Historical Provider, MD  glipiZIDE (GLUCOTROL) 5 MG tablet Take 5 mg by mouth daily before breakfast.   Yes Historical Provider, MD  lisinopril (PRINIVIL,ZESTRIL) 20 MG tablet Take 20 mg by mouth daily. Patient states he does not have HTN, he takes lisinopril for kidney protection from diabetes.   Yes Historical Provider, MD  Omega-3 Fatty Acids (FISH OIL PO) Take  by mouth.   Yes Historical Provider, MD  PREDNISONE PO Take by mouth. Tapered dose, started on 02/04/16   Yes Historical Provider, MD  topiramate (TOPAMAX) 25 MG capsule Take 25 mg by mouth daily. Dosage will increase to BID on 01/14/16 x 7 days, then TID   Yes Historical Provider, MD  vitamin C (ASCORBIC ACID) 500 MG tablet Take 500 mg by mouth daily.   Yes Historical Provider, MD  zinc sulfate 220 MG capsule Take 220 mg by mouth daily.   Yes Historical Provider, MD  FLUoxetine (PROZAC) 10 MG capsule Take 10 mg  by mouth daily.    Historical Provider, MD  naproxen (NAPROSYN) 500 MG tablet Take 1 tablet (500 mg total) by mouth 2 (two) times daily. 02/01/16   April Palumbo, MD  traMADol (ULTRAM) 50 MG tablet Take 50 mg by mouth every 6 (six) hours as needed for moderate pain.    Historical Provider, MD   BP 125/82 mmHg  Pulse 75  Temp(Src) 97.7 F (36.5 C) (Oral)  Resp 18  Ht 5' 9.5" (1.765 m)  Wt 263 lb (119.296 kg)  BMI 38.29 kg/m2  SpO2 96% Physical Exam  Constitutional: He is oriented to person, place, and time. He appears well-developed and well-nourished. No distress.  HENT:  Head: Normocephalic and atraumatic.  Mouth/Throat: Oropharynx is clear and moist.  Neck: Normal range of motion. Neck supple.  Cardiovascular: Normal rate and regular rhythm.  Exam reveals no friction rub.   No murmur heard. Pulmonary/Chest: Effort normal and breath sounds normal. No respiratory distress. He has no wheezes. He has no rales.  Abdominal: Soft. Bowel sounds are normal. He exhibits no distension. There is no tenderness.  Musculoskeletal: Normal range of motion. He exhibits no edema.  Neurological: He is alert and oriented to person, place, and time. No cranial nerve deficit. He exhibits normal muscle tone. Coordination normal.  Skin: Skin is warm and dry. He is not diaphoretic.  Nursing note and vitals reviewed.   ED Course  Procedures (including critical care time) Labs Review Labs Reviewed  BASIC METABOLIC PANEL  CBC WITH DIFFERENTIAL/PLATELET    Imaging Review No results found. I have personally reviewed and evaluated these images and lab results as part of my medical decision-making.    MDM   Final diagnoses:  None    Patient presents with complaints of migraine that has been ongoing for the past month. His neurologic exam is non-focal and head CT performed only one week ago was unremarkable. I see no indication for LP or reimaging. He is feeling somewhat better after a migraine  cocktail. He is sleeping soundly when he was reevaluated. He will be discharged with instruction to continue his medications as prescribed and follow-up with his neurologist to discuss medication adjustments if not improving.    Geoffery Lyonsouglas Haydn Hutsell, MD 02/09/16 [phone]

## 2016-03-30 ENCOUNTER — Emergency Department (HOSPITAL_COMMUNITY)
Admission: EM | Admit: 2016-03-30 | Discharge: 2016-03-31 | Disposition: A | Discharge status: Other Acute Inpatient Hospital | Payer: Managed Care, Other (non HMO) | Attending: Emergency Medicine | Admitting: Emergency Medicine

## 2016-03-30 ENCOUNTER — Encounter (HOSPITAL_COMMUNITY): Payer: Self-pay | Admitting: *Deleted

## 2016-03-30 DIAGNOSIS — R45851 Suicidal ideations: Secondary | ICD-10-CM

## 2016-03-30 DIAGNOSIS — E119 Type 2 diabetes mellitus without complications: Secondary | ICD-10-CM | POA: Insufficient documentation

## 2016-03-30 DIAGNOSIS — I10 Essential (primary) hypertension: Secondary | ICD-10-CM | POA: Diagnosis not present

## 2016-03-30 DIAGNOSIS — Z7984 Long term (current) use of oral hypoglycemic drugs: Secondary | ICD-10-CM | POA: Insufficient documentation

## 2016-03-30 DIAGNOSIS — Z79899 Other long term (current) drug therapy: Secondary | ICD-10-CM | POA: Diagnosis not present

## 2016-03-30 DIAGNOSIS — F329 Major depressive disorder, single episode, unspecified: Secondary | ICD-10-CM | POA: Insufficient documentation

## 2016-03-30 DIAGNOSIS — Z7982 Long term (current) use of aspirin: Secondary | ICD-10-CM | POA: Insufficient documentation

## 2016-03-30 HISTORY — DX: Suicide attempt, initial encounter: T14.91XA

## 2016-03-30 LAB — CBC
HEMATOCRIT: 42.6 % (ref 39.0–52.0)
HEMOGLOBIN: 13.3 g/dL (ref 13.0–17.0)
MCH: 25.2 pg — AB (ref 26.0–34.0)
MCHC: 31.2 g/dL (ref 30.0–36.0)
MCV: 80.7 fL (ref 78.0–100.0)
Platelets: ADEQUATE 10*3/uL (ref 150–400)
RBC: 5.28 MIL/uL (ref 4.22–5.81)
RDW: 15.1 % (ref 11.5–15.5)
WBC: 9.6 10*3/uL (ref 4.0–10.5)

## 2016-03-30 LAB — COMPREHENSIVE METABOLIC PANEL
ALT: 13 U/L — AB (ref 17–63)
AST: 16 U/L (ref 15–41)
Albumin: 3.8 g/dL (ref 3.5–5.0)
Alkaline Phosphatase: 71 U/L (ref 38–126)
Anion gap: 6 (ref 5–15)
BUN: 29 mg/dL — AB (ref 6–20)
CHLORIDE: 108 mmol/L (ref 101–111)
CO2: 22 mmol/L (ref 22–32)
CREATININE: 1.85 mg/dL — AB (ref 0.61–1.24)
Calcium: 9.3 mg/dL (ref 8.9–10.3)
GFR calc non Af Amer: 41 mL/min — ABNORMAL LOW (ref >60)
GFR, EST AFRICAN AMERICAN: 48 mL/min — AB (ref >60)
Glucose, Bld: 171 mg/dL — ABNORMAL HIGH (ref 65–99)
Potassium: 4.5 mmol/L (ref 3.5–5.1)
SODIUM: 136 mmol/L (ref 135–145)
Total Bilirubin: 0.5 mg/dL (ref 0.3–1.2)
Total Protein: 6.8 g/dL (ref 6.5–8.1)

## 2016-03-30 LAB — SALICYLATE LEVEL: Salicylate Lvl: <4 mg/dL (ref 2.8–30.0)

## 2016-03-30 LAB — ACETAMINOPHEN LEVEL

## 2016-03-30 LAB — ETHANOL

## 2016-03-30 MED ORDER — ASPIRIN EC 81 MG PO TBEC: 81.0000 mg | DELAYED_RELEASE_TABLET | Freq: Every day | ORAL | Status: DC

## 2016-03-30 MED ORDER — ZOLPIDEM TARTRATE 5 MG PO TABS: 5.0000 mg | ORAL_TABLET | Freq: Every evening | ORAL | Status: DC | PRN

## 2016-03-30 MED ORDER — GABAPENTIN 300 MG PO CAPS
300.0000 mg | ORAL_CAPSULE | Freq: Two times a day (BID) | ORAL | Status: DC
  Administered 2016-03-31: 300 mg via ORAL
  Filled 2016-03-30: qty 1

## 2016-03-30 MED ORDER — NAPROXEN 250 MG PO TABS
500.0000 mg | ORAL_TABLET | Freq: Two times a day (BID) | ORAL | Status: DC
  Administered 2016-03-31: 500 mg via ORAL
  Filled 2016-03-30: qty 2

## 2016-03-30 MED ORDER — LISINOPRIL 20 MG PO TABS: 20.0000 mg | ORAL_TABLET | Freq: Every day | ORAL | Status: DC

## 2016-03-30 MED ORDER — IBUPROFEN 200 MG PO TABS: 600.0000 mg | ORAL_TABLET | Freq: Three times a day (TID) | ORAL | Status: DC | PRN

## 2016-03-30 MED ORDER — ONDANSETRON HCL 4 MG PO TABS: 4.0000 mg | ORAL_TABLET | Freq: Three times a day (TID) | ORAL | Status: DC | PRN

## 2016-03-30 MED ORDER — ACETAMINOPHEN 325 MG PO TABS
650.0000 mg | ORAL_TABLET | ORAL | Status: DC | PRN
Start: 2016-03-30 — End: 2016-03-31

## 2016-03-30 MED ORDER — GLIPIZIDE 5 MG PO TABS: 5.0000 mg | ORAL_TABLET | Freq: Every day | ORAL | Status: DC

## 2016-03-30 MED ORDER — DULOXETINE HCL 60 MG PO CPEP: 60.0000 mg | ORAL_CAPSULE | Freq: Every day | ORAL | Status: DC

## 2016-03-30 MED ORDER — TOPIRAMATE 25 MG PO CPSP: 25.0000 mg | ORAL_CAPSULE | Freq: Every day | ORAL | Status: DC

## 2016-03-30 MED ORDER — FLUOXETINE HCL 10 MG PO CAPS: 10.0000 mg | ORAL_CAPSULE | Freq: Every day | ORAL | Status: DC

## 2016-03-30 MED ORDER — LORAZEPAM 1 MG PO TABS: 1.0000 mg | ORAL_TABLET | Freq: Three times a day (TID) | ORAL | Status: DC | PRN

## 2016-03-30 NOTE — ED Provider Notes (Signed)
[facility]-EMERGENCY DEPT Provider Note   CSN: [id number] Arrival date & time: 03/30/16  1746     History   Chief Complaint Chief Complaint  Patient presents with  . Suicidal    HPI Samuel Simon is a 50 y.o. male.  HPI Increasing Suicidal thoughts over the past several weeks.  His mother died several days ago.  He saw his therapist today who recommended they come to the ER for placement.  He has suicidal thoughts.  He's had a suicide attempt before in the past.   Past Medical History:  Diagnosis Date  . Anxiety   . Back pain, chronic   . Complication of anesthesia    difficult waking   . Diabetes mellitus   . Generalized headaches   . Hypertension    Denies history of htn.   . Obstructive sleep apnea   . Suicide attempt [facility](H[facility])     Patient Active Problem List   Diagnosis Date Noted  . AKI (acute kidney injury) (HCC) 07/22/2015  . Pain in the chest 07/22/2015  . Chest pain 07/22/2015  . Normocytic hypochromic anemia   . Hypertriglyceridemia 03/01/2012  . Chest pain with moderate risk of acute coronary syndrome 02/28/2012  . Diabetes mellitus type 2 in obese (HCC) 02/28/2012  . HTN (hypertension) 02/28/2012  . Obstructive sleep apnea 02/28/2012    Past Surgical History:  Procedure Laterality Date  . CHOLECYSTECTOMY         Home Medications    Prior to Admission medications   Medication Sig Start Date End Date Taking? Authorizing Provider  aspirin EC 81 MG tablet Take 81 mg by mouth daily.    Historical Provider, MD  cholecalciferol (VITAMIN D) 1000 units tablet Take 1,000 Units by mouth daily.    Historical Provider, MD  DULoxetine (CYMBALTA) 60 MG capsule Take 60 mg by mouth daily.    Historical Provider, MD  FLUoxetine (PROZAC) 10 MG capsule Take 10 mg by mouth daily.    Historical Provider, MD  gabapentin (NEURONTIN) 300 MG capsule Take 300 mg by mouth 2 (two) times daily. Dosage will increase to TID on February 14, 2016    Historical Provider, MD    glipiZIDE (GLUCOTROL) 5 MG tablet Take 5 mg by mouth daily before breakfast.    Historical Provider, MD  lisinopril (PRINIVIL,ZESTRIL) 20 MG tablet Take 20 mg by mouth daily. Patient states he does not have HTN, he takes lisinopril for kidney protection from diabetes.    Historical Provider, MD  naproxen (NAPROSYN) 500 MG tablet Take 1 tablet (500 mg total) by mouth 2 (two) times daily. 02/01/16   April Palumbo, MD  Omega-3 Fatty Acids (FISH OIL PO) Take by mouth.    Historical Provider, MD  PREDNISONE PO Take by mouth. Tapered dose, started on 02/04/16    Historical Provider, MD  topiramate (TOPAMAX) 25 MG capsule Take 25 mg by mouth daily. Dosage will increase to BID on 01/14/16 x 7 days, then TID    Historical Provider, MD  traMADol (ULTRAM) 50 MG tablet Take 50 mg by mouth every 6 (six) hours as needed for moderate pain.    Historical Provider, MD  vitamin C (ASCORBIC ACID) 500 MG tablet Take 500 mg by mouth daily.    Historical Provider, MD  zinc sulfate 220 MG capsule Take 220 mg by mouth daily.    Historical Provider, MD    Family History Family History  Problem Relation Age of Onset  . Heart attack Father   . Hypertension  Father   . Heart attack Brother   . Diabetes Mother   . Hypertension Mother     Social History Social History  Substance Use Topics  . Smoking status: Never Smoker  . Smokeless tobacco: Never Used  . Alcohol use No     Comment: 1 glass wine per week/biweekly     Allergies   Tramadol   Review of Systems Review of Systems  All other systems reviewed and are negative.    Physical Exam Updated Vital Signs BP 125/85 (BP Location: Right Arm)   Pulse 90   Temp 99 F (37.2 C) (Oral)   Resp 19   SpO2 95%   Physical Exam  Constitutional: He is oriented to person, place, and time. He appears well-developed and well-nourished.  HENT:  Head: Normocephalic.  Eyes: EOM are normal.  Neck: Normal range of motion.  Pulmonary/Chest: Effort normal.   Abdominal: He exhibits no distension.  Musculoskeletal: Normal range of motion.  Neurological: He is alert and oriented to person, place, and time.  Psychiatric:  Flat affect. Depressed. suicidal  Nursing note and vitals reviewed.    ED Treatments / Results  Labs (all labs ordered are listed, but only abnormal results are displayed) Labs Reviewed  COMPREHENSIVE METABOLIC PANEL - Abnormal; Notable for the following:       Result Value   Glucose, Bld 171 (*)    BUN 29 (*)    Creatinine, Ser 1.85 (*)    ALT 13 (*)    GFR calc non Af Amer 41 (*)    GFR calc Af Amer 48 (*)    All other components within normal limits  CBC - Abnormal; Notable for the following:    MCH 25.2 (*)    All other components within normal limits  ACETAMINOPHEN LEVEL - Abnormal; Notable for the following:    Acetaminophen (Tylenol), Serum <10 (*)    All other components within normal limits  ETHANOL  SALICYLATE LEVEL  URINE RAPID DRUG SCREEN, HOSP PERFORMED    EKG  EKG Interpretation None       Radiology No results found.  Procedures Procedures (including critical care time)  Medications Ordered in ED Medications - No data to display   Initial Impression / Assessment and Plan / ED Course  I have reviewed the triage vital signs and the nursing notes.  Pertinent labs & imaging results that were available during my care of the patient were reviewed by me and considered in my medical decision making (see chart for details).  Clinical Course    Medically clear. TTS to evaluate  Final Clinical Impressions(s) / ED Diagnoses   Final diagnoses:  None    New Prescriptions New Prescriptions   No medications on file     Azalia BilisKevin Donta Mcinroy, MD 03/30/16 2238

## 2016-03-30 NOTE — ED Triage Notes (Signed)
[facility] and states he attempted suicide in January.  Sunday afternoon his mother passed away and he was not informed until Monday. Pt states that he had to leave to go get mom out of country and was unable to go due to expired passport. Pt states he started lashing out in his group meeting and does not feel safe out there and states he is suicidal .

## 2016-03-31 ENCOUNTER — Inpatient Hospital Stay (HOSPITAL_COMMUNITY)
Admission: EM | Admit: 2016-03-31 | Discharge: 2016-04-09 | DRG: 885 | Disposition: A | Discharge status: Home / Self Care | Payer: Managed Care, Other (non HMO) | Source: Intra-hospital | Attending: Psychiatry | Admitting: Psychiatry

## 2016-03-31 ENCOUNTER — Encounter (HOSPITAL_COMMUNITY): Payer: Self-pay | Admitting: *Deleted

## 2016-03-31 DIAGNOSIS — F332 Major depressive disorder, recurrent severe without psychotic features: Secondary | ICD-10-CM | POA: Diagnosis present

## 2016-03-31 DIAGNOSIS — F329 Major depressive disorder, single episode, unspecified: Secondary | ICD-10-CM | POA: Diagnosis present

## 2016-03-31 DIAGNOSIS — F411 Generalized anxiety disorder: Secondary | ICD-10-CM | POA: Diagnosis not present

## 2016-03-31 DIAGNOSIS — G4733 Obstructive sleep apnea (adult) (pediatric): Secondary | ICD-10-CM | POA: Diagnosis present

## 2016-03-31 DIAGNOSIS — K5900 Constipation, unspecified: Secondary | ICD-10-CM

## 2016-03-31 DIAGNOSIS — Z833 Family history of diabetes mellitus: Secondary | ICD-10-CM | POA: Diagnosis not present

## 2016-03-31 DIAGNOSIS — G4700 Insomnia, unspecified: Secondary | ICD-10-CM | POA: Diagnosis present

## 2016-03-31 DIAGNOSIS — Z8249 Family history of ischemic heart disease and other diseases of the circulatory system: Secondary | ICD-10-CM | POA: Diagnosis not present

## 2016-03-31 DIAGNOSIS — M549 Dorsalgia, unspecified: Secondary | ICD-10-CM | POA: Diagnosis present

## 2016-03-31 DIAGNOSIS — R45851 Suicidal ideations: Secondary | ICD-10-CM | POA: Diagnosis present

## 2016-03-31 DIAGNOSIS — E119 Type 2 diabetes mellitus without complications: Secondary | ICD-10-CM | POA: Diagnosis present

## 2016-03-31 DIAGNOSIS — F322 Major depressive disorder, single episode, severe without psychotic features: Secondary | ICD-10-CM | POA: Diagnosis not present

## 2016-03-31 LAB — URINE MICROSCOPIC-ADD ON: BACTERIA UA: NONE SEEN

## 2016-03-31 LAB — RAPID URINE DRUG SCREEN, HOSP PERFORMED
AMPHETAMINES: NOT DETECTED
BARBITURATES: NOT DETECTED
BENZODIAZEPINES: NOT DETECTED
COCAINE: NOT DETECTED
Opiates: NOT DETECTED
TETRAHYDROCANNABINOL: NOT DETECTED

## 2016-03-31 LAB — URINALYSIS, ROUTINE W REFLEX MICROSCOPIC
Bilirubin Urine: NEGATIVE
GLUCOSE, UA: NEGATIVE mg/dL
KETONES UR: NEGATIVE mg/dL
LEUKOCYTES UA: NEGATIVE
NITRITE: NEGATIVE
PROTEIN: NEGATIVE mg/dL
Specific Gravity, Urine: 1.01 (ref 1.005–1.030)
pH: 5.5 (ref 5.0–8.0)

## 2016-03-31 LAB — GLUCOSE, CAPILLARY
GLUCOSE-CAPILLARY: 117 mg/dL — AB (ref 65–99)
Glucose-Capillary: 83 mg/dL (ref 65–99)
Glucose-Capillary: 114 mg/dL — ABNORMAL HIGH (ref 65–99)

## 2016-03-31 MED ORDER — INSULIN ASPART 100 UNIT/ML ~~LOC~~ SOLN
0.0000 [IU] | Freq: Three times a day (TID) | SUBCUTANEOUS | Status: DC
  Administered 2016-04-01 – 2016-04-02 (×2): 0 [IU] via SUBCUTANEOUS
  Administered 2016-04-02: 7 [IU] via SUBCUTANEOUS
  Administered 2016-04-03 (×2): 3 [IU] via SUBCUTANEOUS
  Administered 2016-04-04 – 2016-04-05 (×2): 4 [IU] via SUBCUTANEOUS
  Administered 2016-04-06: 3 [IU] via SUBCUTANEOUS
  Administered 2016-04-06: 0 [IU] via SUBCUTANEOUS
  Administered 2016-04-07: 4 [IU] via SUBCUTANEOUS
  Administered 2016-04-09: 3 [IU] via SUBCUTANEOUS

## 2016-03-31 MED ORDER — ARIPIPRAZOLE 2 MG PO TABS
2.0000 mg | ORAL_TABLET | Freq: Every day | ORAL | Status: DC
  Administered 2016-03-31 – 2016-04-02 (×3): 2 mg via ORAL
  Filled 2016-03-31 (×5): qty 1

## 2016-03-31 MED ORDER — GABAPENTIN 300 MG PO CAPS
300.0000 mg | ORAL_CAPSULE | Freq: Three times a day (TID) | ORAL | Status: DC
  Filled 2016-03-31 (×5): qty 1

## 2016-03-31 MED ORDER — GLIPIZIDE 5 MG PO TABS
5.0000 mg | ORAL_TABLET | Freq: Every day | ORAL | Status: DC
  Administered 2016-03-31 – 2016-04-09 (×10): 5 mg via ORAL
  Filled 2016-03-31 (×12): qty 1

## 2016-03-31 MED ORDER — MAGNESIUM HYDROXIDE 400 MG/5ML PO SUSP
30.0000 mL | Freq: Every day | ORAL | Status: DC | PRN
Start: 2016-03-31 — End: 2016-04-09

## 2016-03-31 MED ORDER — TRAZODONE HCL 50 MG PO TABS
50.0000 mg | ORAL_TABLET | Freq: Every evening | ORAL | Status: DC | PRN
  Administered 2016-03-31 – 2016-04-08 (×9): 50 mg via ORAL
  Filled 2016-03-31 (×22): qty 1

## 2016-03-31 MED ORDER — ACETAMINOPHEN 325 MG PO TABS
650.0000 mg | ORAL_TABLET | Freq: Four times a day (QID) | ORAL | Status: DC | PRN
  Administered 2016-04-04 – 2016-04-05 (×2): 650 mg via ORAL
  Filled 2016-03-31 (×2): qty 2

## 2016-03-31 MED ORDER — ASPIRIN EC 81 MG PO TBEC
81.0000 mg | DELAYED_RELEASE_TABLET | Freq: Every day | ORAL | Status: DC
  Administered 2016-03-31 – 2016-04-09 (×10): 81 mg via ORAL
  Filled 2016-03-31 (×12): qty 1

## 2016-03-31 MED ORDER — HYDROXYZINE HCL 25 MG PO TABS
25.0000 mg | ORAL_TABLET | Freq: Four times a day (QID) | ORAL | Status: DC | PRN
  Administered 2016-03-31 – 2016-04-07 (×9): 25 mg via ORAL
  Filled 2016-03-31 (×9): qty 1

## 2016-03-31 MED ORDER — DULOXETINE HCL 60 MG PO CPEP
60.0000 mg | ORAL_CAPSULE | Freq: Every day | ORAL | Status: DC
  Administered 2016-03-31 – 2016-04-02 (×3): 60 mg via ORAL
  Filled 2016-03-31 (×5): qty 1

## 2016-03-31 MED ORDER — DULOXETINE HCL 60 MG PO CPEP
60.0000 mg | ORAL_CAPSULE | Freq: Two times a day (BID) | ORAL | Status: DC
  Filled 2016-03-31 (×4): qty 1

## 2016-03-31 MED ORDER — ALUM & MAG HYDROXIDE-SIMETH 200-200-20 MG/5ML PO SUSP: 30.0000 mL | ORAL | Status: DC | PRN

## 2016-03-31 NOTE — Progress Notes (Signed)
D: Pt  SI-contracts for safety denies HI/AVH. Pt is pleasant and cooperative. Pt stated his day was mostly good, pt stated he was good he was here due to it being a safe place. Pt said he was feeling like someone else was controlling his movements before coming in, he would like to get back in control of himself and his life.   A: Pt was offered support and encouragement. Pt was given scheduled medications. Pt was encourage to attend groups. Q 15 minute checks were done for safety.   R:Pt attends groups and interacts well with peers and staff. Pt is taking medication. Pt has no complaints.Pt receptive to treatment and safety maintained on unit.

## 2016-03-31 NOTE — Progress Notes (Signed)
Recreation Therapy Notes  Date: 03/31/16 Time: 0930 Location: 300 Hall Group Room  Group Topic: Stress Management  Goal Area(s) Addresses:  Patient will verbalize importance of using healthy stress management.  Patient will identify positive emotions associated with healthy stress management.   Behavioral Response: Engaged  Intervention: Stress Management  Activity :  Floating on a Cloud.  LRT introduced the technique of guided imagery to the patients.  Patients were asked to follow along as LRT read the script to participate in the activity.  Education: Stress Management, Discharge Planning.   Education Outcome: Acknowledges edcuation/In group clarification offered/Needs additional education  Clinical Observations/Feedback:  Pt attended group.       Caroll RancherMarjette Brannan Cassedy, LRT/CTRS     Caroll RancherLindsay, Kwabena Strutz A 03/31/2016 12:04 PM

## 2016-03-31 NOTE — Tx Team (Signed)
Interdisciplinary Treatment Plan Update (Adult) Date: 03/31/2016    Time Reviewed: 9:30 AM  Progress in Treatment: Attending groups: Continuing to assess, patient new to milieu Participating in groups: Continuing to assess, patient new to milieu Taking medication as prescribed: Yes Tolerating medication: Yes Family/Significant other contact made: No, CSW assessing for appropriate contacts Patient understands diagnosis: Yes Discussing patient identified problems/goals with staff: Yes Medical problems stabilized or resolved: Yes Denies suicidal/homicidal ideation: Yes Issues/concerns per patient self-inventory: Yes Other:  New problem(s) identified: N/A  Discharge Plan or Barriers: CSW continuing to assess, patient new to milieu.  Reason for Continuation of Hospitalization:  Depression Anxiety Medication Stabilization   Comments: N/A  Estimated length of stay: 3-5 days   Patient is a 51 year old male who presented to the hospital with SI and increased depression. Primary triggers for admission include financial & employment stressors, as well as recent death of mother. Patient will benefit from crisis stabilization, medication evaluation, group therapy and psycho education in addition to case management for discharge planning. At discharge, it is recommended that Pt remain compliant with established discharge plan and continued treatment.   Review of initial/current patient goals per problem list:  1. Goal(s): Patient will participate in aftercare plan   Met: No   Target date: 3-5 days post admission date   As evidenced by: Patient will participate within aftercare plan AEB aftercare provider and housing plan at discharge being identified.  8/23: Goal not met: CSW assessing for appropriate referrals for pt and will have follow up secured prior to d/c.   2. Goal (s): Patient will exhibit decreased depressive symptoms and suicidal ideations.   Met: No   Target  date: 3-5 days post admission date   As evidenced by: Patient will utilize self rating of depression at 3 or below and demonstrate decreased signs of depression or be deemed stable for discharge by MD.  8/23: Goal not met: Pt presents with flat affect and depressed mood.  Pt admitted with high levels of depression. Pt to show decreased sign of depression and a rating of 3 or less before d/c.     Attendees: Patient:    Family:    Physician: Dr. Modesta Messing 03/31/2016 9:30 AM  Nursing: Felicity Pellegrini, RN 03/31/2016 9:30 AM  Clinical Social Worker: Tilden Fossa, LCSW 03/31/2016 9:30 AM  Other: Peri Maris, LCSW 03/31/2016 9:30 AM  Other:  03/31/2016 9:30 AM  Other: Lars Pinks, Case Manager 03/31/2016 9:30 AM  Other: Agustina Caroli, May Augustin, NP 03/31/2016 9:30 AM  Other:    Other:    Other:    Other:     Scribe for Treatment Team:  Tilden Fossa, [place]
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
## 2016-03-31 NOTE — ED Notes (Signed)
[facility] [place],  "[facility] and [facility] to 404 bed 1 by Clint Bolderori Beck, [facility]. Attending physician will Dr.Cobos. Report may be called to [phone]"
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
## 2016-03-31 NOTE — ED Notes (Addendum)
Pt departed with [facility], and in NAD. All paperwork and pt belongings turned over to transporter.

## 2016-03-31 NOTE — Progress Notes (Signed)
Patient ID: Samuel Simon, male   DOB: May 03, 1966, 50 y.o.   MRN: [id number]  DAR: Pt. Denies HI and A/V Hallucinations. He reports passive SI but states, "I feel safe here." He is able to contract for safety. He reports sleep is fair, appetite is good, energy level is low, and concentration is good. He rates depression 8/10, hopelessness 10/10, and anxiety 5/10. Patient does report a mild headache but states he does not want intervention at this time. Support and encouragement provided to the patient. Scheduled medications administered to patient per physician's orders. Patient is cooperative and pleasant upon interaction. 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 He continues to use a cane to ambulate on the unit. Q15 minute checks are maintained for safety.

## 2016-03-31 NOTE — [facility] Group Notes (Signed)
[facility] LCSW Group Therapy 03/31/2016  1:15 PM Type of Therapy: Group Therapy Participation Level: Minimal  Participation Quality: Attentive, sharing  Affect: Depressed & flat  Cognitive: Alert and Oriented  Insight: Developing/Improving and Engaged  Engagement in Therapy: Developing/Improving and Engaged  Modes of Intervention: Clarification, Confrontation, Discussion, Education, Exploration, Limit-setting, Orientation, Problem-solving, Rapport Building, Dance movement psychotherapisteality Testing, Socialization and Support  Summary of Progress/Problems: The topic for group today was emotional regulation. This group focused on both positive and negative emotion identification and allowed group members to process ways to identify feelings, regulate negative emotions, and find healthy ways to manage internal/external emotions. Group members were asked to reflect on a time when their reaction to an emotion led to a negative outcome and explored how alternative responses using emotion regulation would have benefited them. Group members were also asked to discuss a time when emotion regulation was utilized when a negative emotion was experienced. Patient discussed persistent negative thoughts and severe hopelessness. He then fell asleep and did not participate any further in group discussion.    Samuella BruinKristin Liviya Santini, MSW, LCSW Clinical Social Worker [facility] [phone]

## 2016-03-31 NOTE — [facility] Group Notes (Signed)
Adult Psychoeducational Group Note  Date:  03/31/2016 Time:  11:12 AM  Group Topic/Focus:  Goals Group:   The focus of this group is to help patients establish daily goals to achieve during treatment and discuss how the patient can incorporate goal setting into their daily lives to aide in recovery.   Participation Level:  Active  Participation Quality:  Appropriate  Affect:  Appropriate  Cognitive:  Alert  Insight: Appropriate  Engagement in Group:  Engaged  Modes of Intervention:  Discussion  Additional Comments:  Pt was engaged in group activity. Pt discussed why he is here and he hope to work on coping skills to get better. Pt goal for today is to focus and get through today.  Dellia NimsJaquesha M Delma Villalva 03/31/2016, 11:12 AM

## 2016-03-31 NOTE — ED Notes (Signed)
Voluntary admission consent and consent to release information obtained from pt. [facility] [phone].

## 2016-03-31 NOTE — [facility] Suicide Risk Assessment (Signed)
[facility]   Nursing information obtained from:   chart review, treatment team meeting Demographic factors:   male, chronic pain, family history of suicide attempt/MH issues Maternal aunt- suicide attempt, she killed her baby Maternal aunt- depression Mother- bipolar disorder Sister- depression Brother- bipolar disorder Current Mental Status:   depressed Loss Factors:   separation from his wife Historical Factors:   history of suicide attempt Risk Reduction Factors:   future oriented, positive therapeutic relationship, social support  Total Time spent with patient: 45 minutes Principal Problem: MDD (major depressive disorder), severe (HCC) Diagnosis:   Patient Active Problem List   Diagnosis Date Noted  . MDD (major depressive disorder), severe (HCC) [F32.2] 03/31/2016  . AKI (acute kidney injury) (HCC) [N17.9] 07/22/2015  . Pain in the chest [R07.9] 07/22/2015  . Chest pain [R07.9] 07/22/2015  . Normocytic hypochromic anemia [D50.9]   . Hypertriglyceridemia [E78.1] 03/01/2012  . Chest pain with moderate risk of acute coronary syndrome [R07.9] 02/28/2012  . Diabetes mellitus type 2 in obese (HCC) [E11.9, E66.9] 02/28/2012  . HTN (hypertension) [I10] 02/28/2012  . Obstructive sleep apnea [G47.33] 02/28/2012   Subjective Data:  He states that he has been significantly depressed due to his worsening back pain and headache for several months. 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 He was told of divorce by his wife of 24 years in Dec 2016, although he still lives with her. He had overdosed on Tylenol Pm and was brought to ED in 10/2015. He continues to feel depressed, hopeless and had thought of slitting his wrist about a week ago. He did not act on it, as he saw his daughter who was crying for him. Although he finds his daughter to be helpful, he  started to think that "none of that matter" and called crisis line. He happened to talk with his therapist on crisis line, who recommended to come to [facility] and was admitted there last Sun. He later found out that he his mother in [place] deceased on that day, although he missed to be informed on that day, despite his family had contacted the hospital. He felt frustrated and "signed out" himself on Monday. He then "broke out" feeling severely depressed. He had wished to have attempt suicide last Thursday so that his family member could have  brought ashes back to [place] when they visited his mother's funeral. He states "I don't know" when he is asked of any current SI, although he contracts for safety. He denies AH/VH.   Continued Clinical Symptoms:  Alcohol Use Disorder Identification Test Final Score (AUDIT): 0 The "Alcohol Use Disorders Identification Test", Guidelines for Use in Primary Care, Second Edition.  World Science writerHealth Organization [facility](WHO). Score between 0-7:  no or low risk or alcohol related problems. Score between 8-15:  moderate risk of alcohol related problems. Score between 16-19:  high risk of alcohol related problems. Score 20 or above:  warrants further diagnostic evaluation for alcohol dependence and treatment.   CLINICAL FACTORS:   Depression:   Anhedonia Hopelessness Insomnia   Musculoskeletal: Strength & Muscle Tone: within normal limits Gait & Station: uses cane Patient leans: N/A  Psychiatric Specialty Exam: Physical Exam  Constitutional: He is oriented to person, place, and time. He appears well-developed and well-nourished.  Neurological: He is alert and oriented to person, place, and time.  No tremors  Review of Systems  Musculoskeletal: Positive for myalgias.  Neurological: Positive for headaches.  Psychiatric/Behavioral: Positive for depression. Negative for hallucinations, substance abuse and suicidal ideas. The patient is nervous/anxious and has  insomnia.     Blood pressure 112/77, pulse 83, temperature 98.5 F (36.9 C), temperature source Oral, resp. rate 16, height 5\' 9"  (1.753 m), weight 260 lb (117.9 kg).Body mass index is 38.4 kg/m.  General Appearance: Fairly Groomed  Eye Contact:  Good  Speech:  Clear and Coherent  Volume:  Normal  Mood:  Depressed  Affect:  Restricted and Tearful  Thought Process:  Coherent and Goal Directed  Orientation:  Full (Time, Place, and Person)  Thought Content:  Logical denies AH/VH  Suicidal Thoughts:  No  Homicidal Thoughts:  No  Memory:  intact  Judgement:  Fair  Insight:  Fair  Psychomotor Activity:  Normal  Concentration:  Concentration: Fair and Attention Span: Fair  Recall:  [employer]  Fund of Knowledge:  Fair  Language:  Fair  Akathisia:  No  Handed:  Right  AIMS (if indicated):     Assets:  Communication Skills Desire for Improvement  ADL's:  Intact  Cognition:  WNL  Sleep:  Number of Hours: 1.25      COGNITIVE FEATURES THAT CONTRIBUTE TO RISK:  None    SUICIDE RISK:   Moderate:  Frequent suicidal ideation with limited intensity, and duration, some specificity in terms of plans, no associated intent, good self-control, limited dysphoria/symptomatology, some risk factors present, and identifiable protective factors, including available and accessible social support.   PLAN OF CARE:  Patient will be admitted to inpatient psychiatric unit for stabilization and safety. Will provide and encourage milieu participation. Provide medication management and maked adjustments as needed.  Will follow daily.   I certify that inpatient services furnished can reasonably be expected to improve the patient's condition.  Neysa Hottereina Eon Zunker, MD 03/31/2016, 2:02 PM

## 2016-03-31 NOTE — BH Assessment (Signed)
[facility] to 404 bed 1 by Clint Bolderori Beck, AC. Attending physician will Dr.Cobos. Report may be called to [phone]. Marquita PalmsMario, RN informed of pt disposition and voluntary admission paperwork has been requested from RN.

## 2016-03-31 NOTE — Tx Team (Signed)
Initial Treatment Plan 03/31/2016 4:07 AM Samuel Simon ZOX:[id number]    PATIENT STRESSORS: Loss of mother Marital or family conflict Medication change or noncompliance   PATIENT STRENGTHS: Ability for insight Active sense of humor Average or above average intelligence Capable of independent living General fund of knowledge Motivation for treatment/growth   PATIENT IDENTIFIED PROBLEMS: Depression  Suicidal thoughts  "help with depression"                 DISCHARGE CRITERIA:  Ability to meet basic life and health needs Improved stabilization in mood, thinking, and/or behavior Verbal commitment to aftercare and medication compliance  PRELIMINARY DISCHARGE PLAN: Attend aftercare/continuing care group Return to previous living arrangement  PATIENT/FAMILY INVOLVEMENT: This treatment plan has been presented to and reviewed with the patient, Samuel Simon, and/or family member, .  The patient and family have been given the opportunity to ask questions and make suggestions.  Samuel Simon, [name], [place] 03/31/2016, 4:07 AM

## 2016-03-31 NOTE — BH Assessment (Signed)
TTS consult has been completed. Clinician consulted with Donell SievertSpencer Simon, PA and pt is recommended for inpatient admission. Clint Bolderori Beck, [facility]. Selena BattenKim, RN informed of pt disposition.

## 2016-03-31 NOTE — Progress Notes (Signed)
Pt attended NA group this evening.  
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
## 2016-04-01 LAB — GLUCOSE, CAPILLARY
GLUCOSE-CAPILLARY: 166 mg/dL — AB (ref 65–99)
Glucose-Capillary: 118 mg/dL — ABNORMAL HIGH (ref 65–99)
Glucose-Capillary: 74 mg/dL (ref 65–99)

## 2016-04-01 LAB — LIPID PANEL
CHOLESTEROL: 201 mg/dL — AB (ref 0–200)
HDL: 46 mg/dL (ref >40)
LDL CALC: 125 mg/dL — AB (ref 0–99)
TRIGLYCERIDES: 152 mg/dL — AB (ref <150)
Total CHOL/HDL Ratio: 4.4 RATIO
VLDL: 30 mg/dL (ref 0–40)

## 2016-04-01 LAB — BASIC METABOLIC PANEL
Anion gap: 6 (ref 5–15)
BUN: 30 mg/dL — AB (ref 6–20)
CHLORIDE: 106 mmol/L (ref 101–111)
CO2: 25 mmol/L (ref 22–32)
Calcium: 9.3 mg/dL (ref 8.9–10.3)
Creatinine, Ser: 1.64 mg/dL — ABNORMAL HIGH (ref 0.61–1.24)
GFR calc Af Amer: 55 mL/min — ABNORMAL LOW (ref >60)
GFR calc non Af Amer: 48 mL/min — ABNORMAL LOW (ref >60)
GLUCOSE: 123 mg/dL — AB (ref 65–99)
POTASSIUM: 4.3 mmol/L (ref 3.5–5.1)
Sodium: 137 mmol/L (ref 135–145)

## 2016-04-01 LAB — TSH: TSH: 1.369 u[IU]/mL (ref 0.350–4.500)

## 2016-04-01 MED ORDER — DIPHENHYDRAMINE-ZINC ACETATE 2-0.1 % EX CREA
TOPICAL_CREAM | Freq: Three times a day (TID) | CUTANEOUS | Status: DC | PRN
  Administered 2016-04-01 – 2016-04-05 (×2): via TOPICAL
  Filled 2016-04-01: qty 28

## 2016-04-01 NOTE — [facility] Group Notes (Signed)
Adult Psychoeducational Group Note  Date:  04/01/2016 Time:  9:53 PM  Group Topic/Focus:  Wrap-Up Group:   The focus of this group is to help patients review their daily goal of treatment and discuss progress on daily workbooks.   Participation Level:  Active  Participation Quality:  Appropriate  Affect:  Appropriate  Cognitive:  Appropriate  Insight: Appropriate and Good  Engagement in Group:  Engaged  Modes of Intervention:  Education  Additional Comments:  Pt stated that a positive thing about himself is that he is friendly and approachable. He stated that he had a great a day but he is tired. Rates his depression a 4 out of 10. No HI/SI. Samuel Simon, Samuel Simon A 04/01/2016, 9:53 PM

## 2016-04-01 NOTE — Progress Notes (Signed)
[facility] MD Progress Note  04/01/2016 3:33 PM Samuel Simon  MRN:  [id number] Subjective:   Patient seen, chart reviewed and case discussed with nursing staff.  - Per report, patient appears to be somnolent most of the time.   He states that he feels sleepy and tired today. He denies any relevance to take medication. He feels safer and at ease in the hospital. He reports passive SI, denies HI/AH/VH.   He reports left arm rash with itchiness.   Principal Problem: MDD (major depressive disorder), severe ([place]) Diagnosis:   Patient Active Problem List   Diagnosis Date Noted  . MDD (major depressive disorder), severe ([place]) [F32.2] 03/31/2016  . AKI (acute kidney injury) ([place]) [N17.9] 07/22/2015  . Pain in the chest [R07.9] 07/22/2015  . Chest pain [R07.9] 07/22/2015  . Normocytic hypochromic anemia [D50.9]   . Hypertriglyceridemia [E78.1] 03/01/2012  . Chest pain with moderate risk of acute coronary syndrome [R07.9] 02/28/2012  . Diabetes mellitus type 2 in obese ([place]) [E11.9, E66.9] 02/28/2012  . HTN (hypertension) [I10] 02/28/2012  . Obstructive sleep apnea [G47.33] 02/28/2012   Total Time spent with patient: 20 minutes  Past Psychiatric History: see HPI  Past Medical History:  Past Medical History:  Diagnosis Date  . Anxiety   . Back pain, chronic   . Complication of anesthesia    difficult waking   . Diabetes mellitus   . Generalized headaches   . Hypertension    Denies history of htn.   . Obstructive sleep apnea   . Suicide attempt [facility])     Past Surgical History:  Procedure Laterality Date  . CHOLECYSTECTOMY     Family History:  Family History  Problem Relation Age of Onset  . Heart attack Father   . Hypertension Father   . Heart attack Brother   . Diabetes Mother   . Hypertension Mother    Family Psychiatric  History: see HPI Social History:  History  Alcohol Use No    Comment: 1 glass wine per week/biweekly     History  Drug Use No    Social  History   Social History  . Marital status: Married    Spouse name: N/A  . Number of children: N/A  . Years of education: N/A   Social History Main Topics  . Smoking status: Never Smoker  . Smokeless tobacco: Never Used  . Alcohol use No     Comment: 1 glass wine per week/biweekly  . Drug use: No  . Sexual activity: Not Asked   Other Topics Concern  . None   Social History Narrative  . None   Additional Social History:                         Sleep: Good  Appetite:  Good  Current Medications: Current Facility-Administered Medications  Medication Dose Route Frequency Provider Last Rate Last Dose  . acetaminophen (TYLENOL) tablet 650 mg  650 mg Oral Q6H PRN Laverle Hobby, PA-C      . alum & mag hydroxide-simeth (MAALOX/MYLANTA) 200-200-20 MG/5ML suspension 30 mL  30 mL Oral Q4H PRN Laverle Hobby, PA-C      . ARIPiprazole (ABILIFY) tablet 2 mg  2 mg Oral Daily Norman Clay, MD   2 mg at 04/01/16 0825  . aspirin EC tablet 81 mg  81 mg Oral Daily Laverle Hobby, PA-C   81 mg at 04/01/16 0825  . DULoxetine (CYMBALTA) DR capsule 60 mg  60 mg Oral Daily Norman Clay, MD   60 mg at 04/01/16 0825  . glipiZIDE (GLUCOTROL) tablet 5 mg  5 mg Oral QAC breakfast Laverle Hobby, PA-C   5 mg at 04/01/16 5329  . hydrOXYzine (ATARAX/VISTARIL) tablet 25 mg  25 mg Oral Q6H PRN Laverle Hobby, PA-C   25 mg at 03/31/16 2147  . insulin aspart (novoLOG) injection 0-20 Units  0-20 Units Subcutaneous TID WC [name], PA-C      . magnesium hydroxide (MILK OF MAGNESIA) suspension 30 mL  30 mL Oral Daily PRN Laverle Hobby, PA-C      . traZODone (DESYREL) tablet 50 mg  50 mg Oral QHS,MR X 1 Laverle Hobby, PA-C   50 mg at 03/31/16 2147    Lab Results:  Results for orders placed or performed during the hospital encounter of 03/31/16 (from the past 48 hour(s))  Urinalysis, Routine w reflex microscopic (not at [facility])     Status: Abnormal   Collection Time: 03/31/16  4:02 AM   Result Value Ref Range   Color, Urine YELLOW YELLOW   APPearance CLEAR CLEAR   Specific Gravity, Urine 1.010 1.005 - 1.030   pH 5.5 5.0 - 8.0   Glucose, UA NEGATIVE NEGATIVE mg/dL   Hgb urine dipstick LARGE (A) NEGATIVE   Bilirubin Urine NEGATIVE NEGATIVE   Ketones, ur NEGATIVE NEGATIVE mg/dL   Protein, ur NEGATIVE NEGATIVE mg/dL   Nitrite NEGATIVE NEGATIVE   Leukocytes, UA NEGATIVE NEGATIVE    Comment: Performed at [facility]  Urine microscopic-add on     Status: Abnormal   Collection Time: 03/31/16  4:02 AM  Result Value Ref Range   Squamous Epithelial / LPF 0-5 (A) NONE SEEN   WBC, UA 0-5 0 - 5 WBC/hpf   RBC / HPF 0-5 0 - 5 RBC/hpf   Bacteria, UA NONE SEEN NONE SEEN    Comment: Performed at [facility]  Glucose, capillary     Status: Abnormal   Collection Time: 03/31/16  6:00 AM  Result Value Ref Range   Glucose-Capillary 117 (H) 65 - 99 mg/dL   Comment 1 Notify RN    Comment 2 Document in Chart   Glucose, capillary     Status: None   Collection Time: 03/31/16 12:02 PM  Result Value Ref Range   Glucose-Capillary 83 65 - 99 mg/dL  Glucose, capillary     Status: Abnormal   Collection Time: 03/31/16  5:01 PM  Result Value Ref Range   Glucose-Capillary 114 (H) 65 - 99 mg/dL   Comment 1 Notify RN    Comment 2 Document in Chart   Glucose, capillary     Status: Abnormal   Collection Time: 04/01/16  6:06 AM  Result Value Ref Range   Glucose-Capillary 118 (H) 65 - 99 mg/dL  Lipid panel, fasting     Status: Abnormal   Collection Time: 04/01/16  6:47 AM  Result Value Ref Range   Cholesterol 201 (H) 0 - 200 mg/dL   Triglycerides 152 (H) <150 mg/dL   HDL 46 >40 mg/dL   Total CHOL/HDL Ratio 4.4 RATIO   VLDL 30 0 - 40 mg/dL   LDL Cholesterol 125 (H) 0 - 99 mg/dL    Comment:        Total Cholesterol/HDL:CHD Risk Coronary Heart Disease Risk Table                     Men  Women  1/2 Average Risk   3.4   3.3  Average Risk       5.0    4.4  2 X Average Risk   9.6   7.1  3 X Average Risk  23.4   11.0        Use the calculated Patient Ratio above and the CHD Risk Table to determine the patient's CHD Risk.        ATP III CLASSIFICATION (LDL):  <100     mg/dL   Optimal  100-129  mg/dL   Near or Above                    Optimal  130-159  mg/dL   Borderline  160-189  mg/dL   High  >190     mg/dL   Very High Performed at [place] metabolic panel     Status: Abnormal   Collection Time: 04/01/16  6:47 AM  Result Value Ref Range   Sodium 137 135 - 145 mmol/L   Potassium 4.3 3.5 - 5.1 mmol/L   Chloride 106 101 - 111 mmol/L   CO2 25 22 - 32 mmol/L   Glucose, Bld 123 (H) 65 - 99 mg/dL   BUN 30 (H) 6 - 20 mg/dL   Creatinine, Ser 1.64 (H) 0.61 - 1.24 mg/dL   Calcium 9.3 8.9 - 10.3 mg/dL   GFR calc non Af Amer 48 (L) >60 mL/min   GFR calc Af Amer 55 (L) >60 mL/min    Comment: (NOTE) The eGFR has been calculated using the CKD EPI equation. This calculation has not been validated in all clinical situations. eGFR's persistently <60 mL/min signify possible Chronic Kidney Disease.    Anion gap 6 5 - 15    Comment: Performed at [facility]  TSH     Status: None   Collection Time: 04/01/16  6:47 AM  Result Value Ref Range   TSH 1.369 0.350 - 4.500 uIU/mL    Comment: Performed at [facility]  Glucose, capillary     Status: None   Collection Time: 04/01/16 12:12 PM  Result Value Ref Range   Glucose-Capillary 74 65 - 99 mg/dL   Comment 1 Notify RN    Comment 2 Document in Chart     Blood Alcohol level:  Lab Results  Component Value Date   ETH <5 03/30/2016   ETH <5 33/35/4562    Metabolic Disorder Labs: Lab Results  Component Value Date   HGBA1C 8.4 (H) 07/22/2015   MPG 194 07/22/2015   No results found for: PROLACTIN Lab Results  Component Value Date   CHOL 201 (H) 04/01/2016   TRIG 152 (H) 04/01/2016   HDL 46 04/01/2016   CHOLHDL 4.4 04/01/2016    VLDL 30 04/01/2016   LDLCALC 125 (H) 04/01/2016   LDLCALC 62 02/29/2012    Physical Findings: AIMS: Facial and Oral Movements Muscles of Facial Expression: None, normal Lips and Perioral Area: None, normal Jaw: None, normal Tongue: None, normal,Extremity Movements Upper (arms, wrists, hands, fingers): None, normal Lower (legs, knees, ankles, toes): None, normal, Trunk Movements Neck, shoulders, hips: None, normal, Overall Severity Severity of abnormal movements (highest score from questions above): None, normal Incapacitation due to abnormal movements: None, normal Patient's awareness of abnormal movements (rate only patient's report): No Awareness, Dental Status Current problems with teeth and/or dentures?: No Does patient usually wear dentures?: No  CIWA:    COWS:  Musculoskeletal: Strength & Muscle Tone: within normal limits Gait & Station: normal Patient leans: N/A  Psychiatric Specialty Exam: Physical Exam  ROS  Blood pressure 102/72, pulse 78, temperature 97.5 F (36.4 C), temperature source Oral, resp. rate 16, height _0  (1.753 m), weight 260 lb (117.9 kg).Body mass index is 38.4 kg/m.  General Appearance: Fairly Groomed  Eye Contact:  Fair  Speech:  Clear and Coherent  Volume:  Normal  Mood:  Depressed  Affect:  Depressed and Restricted  Thought Process:  Coherent and Goal Directed  Orientation:  Full (Time, Place, and Person)  Thought Content:  Logical Perceptions: denies AH/VH  Suicidal Thoughts:  Yes.  without intent/plan  Homicidal Thoughts:  No  Memory:  intact  Judgement:  Fair  Insight:  Fair  Psychomotor Activity:  Decreased  Concentration:  Concentration: Fair and Attention Span: Fair  Recall:  [employer] of Knowledge:  Fair  Language:  Fair  Akathisia:  No  Handed:  Right  AIMS (if indicated):     Assets:  Communication Skills Desire for Improvement  ADL's:  Intact  Cognition:  WNL  Sleep:  Number of Hours: 6   Assessment Mr.  Wagar Hoover Browns) is a 50 year old male with depression, chronic pain, hypertension, diabetes, obstructive sleep apnea, temporal arteritis per patient report who presented to the ED for worsening suicidal ideation with plan to cutting his wrist. Psychosocial stressors including worsening pain, marital discordance and recent death of his mother.   # MDD, severe, recurrent, without psychotic features Today's exam is notable for his fatigue and slowed psychomotor activity, although there has been an improvement in his SI. Will consider uptitration of Abilify if no improvement in his neurovegetative symptoms tomorrow.    Plans - Continue Cymbalta 60 mg daily - Continue Abilify 2 mg daily - Reviewed Creatinine - 1.64, baseline - Reviewed TSH; 1.369 - Medication management to reduce current symptoms to base line and improve the patient's overall level of functioning. - Monitor for the adverse effect of the medications and anger outbursts - Continue 15 minutes observation for safety concerns - Encouraged to participate in milieu therapy and group therapy counseling sessions and also work with coping skills -  Develop treatment plan to decrease risk of relapse upon discharge and to reduce the need for readmission. -  Psycho-social education regarding relapse prevention and self care. - Health care follow up as needed for medical problems. - Restart home medications where appropriate.  Treatment Plan Summary: Daily contact with patient to assess and evaluate symptoms and progress in treatment  Norman Clay, MD 04/01/2016, 3:33 PM

## 2016-04-01 NOTE — Tx Team (Signed)
Interdisciplinary Treatment and Diagnostic Plan Update  04/01/2016 Time of Session: 9:30am Samuel Simon MRN: [id number]  Principal Diagnosis: MDD (major depressive disorder), severe (HCC)  Secondary Diagnoses: Principal Problem:   MDD (major depressive disorder), severe (HCC)   Current Medications:  Current Facility-Administered Medications  Medication Dose Route Frequency Provider Last Rate Last Dose  . acetaminophen (TYLENOL) tablet 650 mg  650 mg Oral Q6H PRN [name], PA-C      . alum & mag hydroxide-simeth (MAALOX/MYLANTA) 200-200-20 MG/5ML suspension 30 mL  30 mL Oral Q4H PRN [name], PA-C      . ARIPiprazole (ABILIFY) tablet 2 mg  2 mg Oral Daily Reina Hisada, MD   2 mg at 04/01/16 0825  . aspirin EC tablet 81 mg  81 mg Oral Daily [name], PA-C   81 mg at 04/01/16 0825  . DULoxetine (CYMBALTA) DR capsule 60 mg  60 mg Oral Daily Reina Hisada, MD   60 mg at 04/01/16 0825  . glipiZIDE (GLUCOTROL) tablet 5 mg  5 mg Oral QAC breakfast [name], PA-C   5 mg at 04/01/16 0628  . hydrOXYzine (ATARAX/VISTARIL) tablet 25 mg  25 mg Oral Q6H PRN [name], PA-C   25 mg at 03/31/16 2147  . insulin aspart (novoLOG) injection 0-20 Units  0-20 Units Subcutaneous TID WC [name], PA-C      . magnesium hydroxide (MILK OF MAGNESIA) suspension 30 mL  30 mL Oral Daily PRN [name], PA-C      . traZODone (DESYREL) tablet 50 mg  50 mg Oral QHS,MR X 1 [name], PA-C   50 mg at 03/31/16 2147   PTA Medications: Prescriptions Prior to Admission  Medication Sig Dispense Refill Last Dose  . aspirin EC 81 MG tablet Take 81 mg by mouth daily.   12/30/2015  . cholecalciferol (VITAMIN D) 1000 units tablet Take 1,000 Units by mouth daily.   12/30/2015  . DULoxetine (CYMBALTA) 60 MG capsule Take 60 mg by mouth daily.     . glipiZIDE (GLUCOTROL) 5 MG tablet Take 5 mg by mouth daily before breakfast.   12/30/2015  . lisinopril (PRINIVIL,ZESTRIL) 20 MG  tablet Take 20 mg by mouth daily. Patient states he does not have HTN, he takes lisinopril for kidney protection from diabetes.   12/30/2015  . topiramate (TOPAMAX) 25 MG capsule Take 100 mg by mouth at bedtime.      . naproxen (NAPROSYN) 500 MG tablet Take 1 tablet (500 mg total) by mouth 2 (two) times daily. (Patient not taking: Reported on 03/31/2016) 30 tablet 0 Not Taking at Unknown time    Treatment Modalities: Medication Management, Group therapy, Case management,  1 to 1 session with clinician, Psychoeducation, Recreational therapy.   Physician Treatment Plan for Primary Diagnosis: MDD (major depressive disorder), severe (HCC) Long Term Goal(s): Improvement in symptoms so as ready for discharge   Short Term Goals: Ability to verbalize feelings will improve and Ability to disclose and discuss suicidal ideas  Medication Management: Evaluate patient's response, side effects, and tolerance of medication regimen.  Therapeutic Interventions: 1 to 1 sessions, Unit Group sessions and Medication administration.  Evaluation of Outcomes: Progressing  RN Treatment Plan for Primary Diagnosis: MDD (major depressive disorder), severe (HCC) Long Term Goal(s): Knowledge of disease and therapeutic regimen to maintain health will improve  Short Term Goals: Ability to participate in decision making will improve and Ability to disclose and discuss suicidal ideas  Medication   Management: RN will administer medications as ordered by provider, will assess and evaluate patient's response and provide education to patient for prescribed medication. RN will report any adverse and/or side effects to prescribing provider.  Therapeutic Interventions: 1 on 1 counseling sessions, Psychoeducation, Medication administration, Evaluate responses to treatment, Monitor vital signs and CBGs as ordered, Perform/monitor CIWA, COWS, AIMS and Fall Risk screenings as ordered, Perform wound care treatments as  ordered.  Evaluation of Outcomes: Not Met   LCSW Treatment Plan for Primary Diagnosis: MDD (major depressive disorder), severe (HCC) Long Term Goal(s): Safe transition to appropriate next level of care at discharge, Engage patient in therapeutic group addressing interpersonal concerns.  Short Term Goals: Engage patient in aftercare planning with referrals and resources, Increase emotional regulation and Increase skills for wellness and recovery  Therapeutic Interventions: Assess for all discharge needs, 1 to 1 time with Social worker, Explore available resources and support systems, Assess for adequacy in community support network, Educate family and significant other(s) on suicide prevention, Complete Psychosocial Assessment, Interpersonal group therapy.  Evaluation of Outcomes: Progressing   Progress in Treatment : Attending groups: Continuing to assess  Participating in groups: Continuing to assess  Taking medication as prescribed: Yes, MD continuing to assess for appropriate medication regimen  Toleration medication: Yes  Family/Significant other contact made: Treatment team assessing for appropriate contacts  Patient understands diagnosis: Yes  Discussing patient identified problems/goals with staff: Yes  Medical problems stabilized or resolved: Yes  Denies suicidal/homicidal ideation: Treatment team continuing to asses  Issues/concerns per patient self-inventory: None reported  Other: N/A  New problem(s) identified: None reported at this time    New Short Term/Long Term Goal(s): None at this time    Discharge Plan or Barriers: Treatment team continuing to assess.    Reason for Continuation of Hospitalization:  Anxiety  Depression  Hallucinations  Mania  Medication stabilization  Suicidal ideation  Withdrawal symptoms   Estimated Length of Stay: 3-5 days    Attendees:  Patient:                         Physician: Dr. Hisada, MD  04/01/2016    9:30am  Nursing: Roni Waller, Karen Shugart, RN   04/01/2016 9:30am  RN Care Manager:   Social Workers: Lauren Carter, LCSW,  , LCSW  04/01/2016 9:30am  Nurse Pratictioners: May Augustin, NP, Agnes Nwoko, NP 04/01/2016 9:30am    Scribe for Treatment Team:  , LCSW Clinical Social Worker [facility] [phone]    
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
## 2016-04-02 LAB — GLUCOSE, CAPILLARY
Glucose-Capillary: 123 mg/dL — ABNORMAL HIGH (ref 65–99)
Glucose-Capillary: 99 mg/dL (ref 65–99)
Glucose-Capillary: 206 mg/dL — ABNORMAL HIGH (ref 65–99)

## 2016-04-02 LAB — HEMOGLOBIN A1C
Hgb A1c MFr Bld: 8.1 % — ABNORMAL HIGH (ref 4.8–5.6)
Mean Plasma Glucose: 186 mg/dL

## 2016-04-02 MED ORDER — DULOXETINE HCL 60 MG PO CPEP
90.0000 mg | ORAL_CAPSULE | Freq: Every day | ORAL | Status: DC
  Administered 2016-04-03 – 2016-04-09 (×7): 90 mg via ORAL
  Filled 2016-04-02 (×8): qty 1

## 2016-04-02 MED ORDER — ARIPIPRAZOLE 5 MG PO TABS
5.0000 mg | ORAL_TABLET | Freq: Every day | ORAL | Status: DC
  Administered 2016-04-03 – 2016-04-09 (×7): 5 mg via ORAL
  Filled 2016-04-02 (×8): qty 1

## 2016-04-02 NOTE — Progress Notes (Signed)
D.  Pt presents with flat affect but pleasant demeanor.  Pt was positive for evening wrap up group, minimal interaction with peers on unit.  Pt played solitaire in dayroom until bedtime.  No complaints voiced.  Pt denies SI/HI/hallucinations at this time.  A.  Support and encouragement offered, medications given as ordered  R.  Pt remains safe on the unit, will continue to monitor.

## 2016-04-02 NOTE — Progress Notes (Signed)
Patient ID: Samuel Simon, male   DOB: 01-Aug-1966, 50 y.o.   MRN: [id number] [facility] MD Progress Note  04/02/2016 3:19 PM Dawid Floyd  MRN:  [id number] Subjective:   Patient seen, chart reviewed and case discussed with nursing staff.  Patient reports he had a panic attack today while in the dayroom. He feels anxious as he cannot think of any trigger. He continues to feel depressed although it has been improving. He has been trying to make himself busy so that he will not ruminate on his depression/SI. He denies SI. He denies AH/VH.  Principal Problem: MDD (major depressive disorder), severe ([place]) Diagnosis:   Patient Active Problem List   Diagnosis Date Noted  . MDD (major depressive disorder), severe ([place]) [F32.2] 03/31/2016  . AKI (acute kidney injury) ([place]) [N17.9] 07/22/2015  . Pain in the chest [R07.9] 07/22/2015  . Chest pain [R07.9] 07/22/2015  . Normocytic hypochromic anemia [D50.9]   . Hypertriglyceridemia [E78.1] 03/01/2012  . Chest pain with moderate risk of acute coronary syndrome [R07.9] 02/28/2012  . Diabetes mellitus type 2 in obese ([place]) [E11.9, E66.9] 02/28/2012  . HTN (hypertension) [I10] 02/28/2012  . Obstructive sleep apnea [G47.33] 02/28/2012   Total Time spent with patient: 20 minutes  Past Psychiatric History: see HPI  Past Medical History:  Past Medical History:  Diagnosis Date  . Anxiety   . Back pain, chronic   . Complication of anesthesia    difficult waking   . Diabetes mellitus   . Generalized headaches   . Hypertension    Denies history of htn.   . Obstructive sleep apnea   . Suicide attempt [facility])     Past Surgical History:  Procedure Laterality Date  . CHOLECYSTECTOMY     Family History:  Family History  Problem Relation Age of Onset  . Heart attack Father   . Hypertension Father   . Heart attack Brother   . Diabetes Mother   . Hypertension Mother    Family Psychiatric  History: see HPI Social History:  History  Alcohol  Use No    Comment: 1 glass wine per week/biweekly     History  Drug Use No    Social History   Social History  . Marital status: Married    Spouse name: N/A  . Number of children: N/A  . Years of education: N/A   Social History Main Topics  . Smoking status: Never Smoker  . Smokeless tobacco: Never Used  . Alcohol use No     Comment: 1 glass wine per week/biweekly  . Drug use: No  . Sexual activity: Not Asked   Other Topics Concern  . None   Social History Narrative  . None   Additional Social History:                         Sleep: Good  Appetite:  Good  Current Medications: Current Facility-Administered Medications  Medication Dose Route Frequency Provider Last Rate Last Dose  . acetaminophen (TYLENOL) tablet 650 mg  650 mg Oral Q6H PRN Laverle Hobby, PA-C      . alum & mag hydroxide-simeth (MAALOX/MYLANTA) 200-200-20 MG/5ML suspension 30 mL  30 mL Oral Q4H PRN Laverle Hobby, PA-C      . ARIPiprazole (ABILIFY) tablet 2 mg  2 mg Oral Daily Norman Clay, MD   2 mg at 04/02/16 5993  . aspirin EC tablet 81 mg  81 mg Oral Daily Laverle Hobby, PA-C  81 mg at 04/02/16 0733  . diphenhydrAMINE-zinc acetate (BENADRYL) 2-0.1 % cream   Topical TID PRN Norman Clay, MD      . DULoxetine (CYMBALTA) DR capsule 60 mg  60 mg Oral Daily Norman Clay, MD   60 mg at 04/02/16 0733  . glipiZIDE (GLUCOTROL) tablet 5 mg  5 mg Oral QAC breakfast Laverle Hobby, PA-C   5 mg at 04/02/16 7654  . hydrOXYzine (ATARAX/VISTARIL) tablet 25 mg  25 mg Oral Q6H PRN Laverle Hobby, PA-C   25 mg at 04/02/16 1040  . insulin aspart (novoLOG) injection 0-20 Units  0-20 Units Subcutaneous TID WC Laverle Hobby, PA-C   0 Units at 04/02/16 1212  . magnesium hydroxide (MILK OF MAGNESIA) suspension 30 mL  30 mL Oral Daily PRN Laverle Hobby, PA-C      . traZODone (DESYREL) tablet 50 mg  50 mg Oral QHS,MR X 1 Laverle Hobby, PA-C   50 mg at 04/01/16 2301    Lab Results:  Results for  orders placed or performed during the hospital encounter of 03/31/16 (from the past 48 hour(s))  Glucose, capillary     Status: Abnormal   Collection Time: 03/31/16  5:01 PM  Result Value Ref Range   Glucose-Capillary 114 (H) 65 - 99 mg/dL   Comment 1 Notify RN    Comment 2 Document in Chart   Glucose, capillary     Status: Abnormal   Collection Time: 04/01/16  6:06 AM  Result Value Ref Range   Glucose-Capillary 118 (H) 65 - 99 mg/dL  Hemoglobin A1c     Status: Abnormal   Collection Time: 04/01/16  6:47 AM  Result Value Ref Range   Hgb A1c MFr Bld 8.1 (H) 4.8 - 5.6 %    Comment: (NOTE)         Pre-diabetes: 5.7 - 6.4         Diabetes: >6.4         Glycemic control for adults with diabetes: <7.0    Mean Plasma Glucose 186 mg/dL    Comment: (NOTE) Performed At: [facility] [place], [place] [id number] Lindon Romp MD CL:[id number] Performed at [facility]   Lipid panel, fasting     Status: Abnormal   Collection Time: 04/01/16  6:47 AM  Result Value Ref Range   Cholesterol 201 (H) 0 - 200 mg/dL   Triglycerides 152 (H) <150 mg/dL   HDL 46 >40 mg/dL   Total CHOL/HDL Ratio 4.4 RATIO   VLDL 30 0 - 40 mg/dL   LDL Cholesterol 125 (H) 0 - 99 mg/dL    Comment:        Total Cholesterol/HDL:CHD Risk Coronary Heart Disease Risk Table                     Men   Women  1/2 Average Risk   3.4   3.3  Average Risk       5.0   4.4  2 X Average Risk   9.6   7.1  3 X Average Risk  23.4   11.0        Use the calculated Patient Ratio above and the CHD Risk Table to determine the patient's CHD Risk.        ATP III CLASSIFICATION (LDL):  <100     mg/dL   Optimal  100-129  mg/dL   Near or Above  Optimal  130-159  mg/dL   Borderline  160-189  mg/dL   High  >190     mg/dL   Very High Performed at [facility] metabolic panel     Status: Abnormal   Collection Time: 04/01/16  6:47 AM  Result Value Ref Range    Sodium 137 135 - 145 mmol/L   Potassium 4.3 3.5 - 5.1 mmol/L   Chloride 106 101 - 111 mmol/L   CO2 25 22 - 32 mmol/L   Glucose, Bld 123 (H) 65 - 99 mg/dL   BUN 30 (H) 6 - 20 mg/dL   Creatinine, Ser 1.64 (H) 0.61 - 1.24 mg/dL   Calcium 9.3 8.9 - 10.3 mg/dL   GFR calc non Af Amer 48 (L) >60 mL/min   GFR calc Af Amer 55 (L) >60 mL/min    Comment: (NOTE) The eGFR has been calculated using the CKD EPI equation. This calculation has not been validated in all clinical situations. eGFR's persistently <60 mL/min signify possible Chronic Kidney Disease.    Anion gap 6 5 - 15    Comment: Performed at [facility]  TSH     Status: None   Collection Time: 04/01/16  6:47 AM  Result Value Ref Range   TSH 1.369 0.350 - 4.500 uIU/mL    Comment: Performed at [facility]  Glucose, capillary     Status: None   Collection Time: 04/01/16 12:12 PM  Result Value Ref Range   Glucose-Capillary 74 65 - 99 mg/dL   Comment 1 Notify RN    Comment 2 Document in Chart   Glucose, capillary     Status: Abnormal   Collection Time: 04/01/16  5:08 PM  Result Value Ref Range   Glucose-Capillary 166 (H) 65 - 99 mg/dL   Comment 1 Notify RN    Comment 2 Document in Chart   Glucose, capillary     Status: Abnormal   Collection Time: 04/02/16  6:36 AM  Result Value Ref Range   Glucose-Capillary 123 (H) 65 - 99 mg/dL  Glucose, capillary     Status: None   Collection Time: 04/02/16 12:02 PM  Result Value Ref Range   Glucose-Capillary 99 65 - 99 mg/dL   Comment 1 Notify RN    Comment 2 Document in Chart     Blood Alcohol level:  Lab Results  Component Value Date   ETH <5 03/30/2016   ETH <5 73/41/9379    Metabolic Disorder Labs: Lab Results  Component Value Date   HGBA1C 8.1 (H) 04/01/2016   MPG 186 04/01/2016   MPG 194 07/22/2015   No results found for: PROLACTIN Lab Results  Component Value Date   CHOL 201 (H) 04/01/2016   TRIG 152 (H) 04/01/2016   HDL 46  04/01/2016   CHOLHDL 4.4 04/01/2016   VLDL 30 04/01/2016   LDLCALC 125 (H) 04/01/2016   LDLCALC 62 02/29/2012    Physical Findings: AIMS: Facial and Oral Movements Muscles of Facial Expression: None, normal Lips and Perioral Area: None, normal Jaw: None, normal Tongue: None, normal,Extremity Movements Upper (arms, wrists, hands, fingers): None, normal Lower (legs, knees, ankles, toes): None, normal, Trunk Movements Neck, shoulders, hips: None, normal, Overall Severity Severity of abnormal movements (highest score from questions above): None, normal Incapacitation due to abnormal movements: None, normal Patient's awareness of abnormal movements (rate only patient's report): No Awareness, Dental Status Current problems with teeth and/or dentures?: No Does patient usually wear  dentures?: No  CIWA:    COWS:     Musculoskeletal: Strength & Muscle Tone: within normal limits Gait & Station: normal Patient leans: N/A  Psychiatric Specialty Exam: Physical Exam  Constitutional: He is oriented to person, place, and time. He appears well-developed and well-nourished.  Neurological: He is alert and oriented to person, place, and time.    Review of Systems  Skin: Positive for rash.  Psychiatric/Behavioral: Positive for depression. Negative for hallucinations, substance abuse and suicidal ideas. The patient is nervous/anxious. The patient does not have insomnia.   All other systems reviewed and are negative.   Blood pressure 105/62, pulse 76, temperature 97.5 F (36.4 C), resp. rate 16, height 5' 9"  (1.753 m), weight 260 lb (117.9 kg).Body mass index is 38.4 kg/m.  General Appearance: Fairly Groomed  Eye Contact:  Fair  Speech:  Clear and Coherent  Volume:  Normal  Mood:  Depressed  Affect:  Depressed and Restricted- improving  Thought Process:  Coherent and Goal Directed  Orientation:  Full (Time, Place, and Person)  Thought Content:  Logical Perceptions: denies AH/VH  Suicidal  Thoughts:  Yes.  without intent/plan  Homicidal Thoughts:  No  Memory:  intact  Judgement:  Fair  Insight:  Fair  Psychomotor Activity:  Decreased  Concentration:  Concentration: Fair and Attention Span: Fair  Recall:  [employer] of Knowledge:  Fair  Language:  Fair  Akathisia:  No  Handed:  Right  AIMS (if indicated):     Assets:  Communication Skills Desire for Improvement  ADL's:  Intact  Cognition:  WNL  Sleep:  Number of Hours: 6   Assessment Mr. Seckman Hoover Browns) is a 50 year old male with depression, chronic pain, hypertension, diabetes, obstructive sleep apnea, temporal arteritis per patient report who presented to the ED for worsening suicidal ideation with plan to cutting his wrist. Psychosocial stressors including worsening pain, marital discordance and recent death of his mother.   # MDD, severe, recurrent, without psychotic features He continues to endorse neurovegetative symptoms although there has been some improvement since admission. Will uptitrate Cymbalta and Abilify.   Plans - Increase Cymbalta 90 mg daily - Continue Abilify 5 mg daily - Reviewed Creatinine - 1.64, baseline - Reviewed TSH; 1.369 - Medication management to reduce current symptoms to base line and improve the patient's overall level of functioning. - Monitor for the adverse effect of the medications and anger outbursts - Continue 15 minutes observation for safety concerns - Encouraged to participate in milieu therapy and group therapy counseling sessions and also work with coping skills -  Develop treatment plan to decrease risk of relapse upon discharge and to reduce the need for readmission. -  Psycho-social education regarding relapse prevention and self care. - Health care follow up as needed for medical problems. - Restart home medications where appropriate.  Treatment Plan Summary: Daily contact with patient to assess and evaluate symptoms and progress in treatment  Norman Clay,  MD 04/02/2016, 3:19 PM

## 2016-04-02 NOTE — Progress Notes (Signed)
D: Pt denies SI/HI/AVH. Pt is pleasant and cooperative. Pt stated he was feeling a little better, pt seems to be getting more comfortable with the unit and his Tx.    A: Pt was offered support and encouragement. Pt was given scheduled medications. Pt was encourage to attend groups. Q 15 minute checks were done for safety.   R:Pt attends groups and interacts well with peers and staff. Pt is taking medication. Pt has no complaints.Pt receptive to treatment and safety maintained on unit.

## 2016-04-02 NOTE — Progress Notes (Signed)
Recreation Therapy Notes  Date: 04/02/16 Time: 0945 Location: 300 Hall Group Room  Group Topic: Stress Management  Goal Area(s) Addresses:  Patient will verbalize importance of using healthy stress management.  Patient will identify positive emotions associated with healthy stress management.   Intervention: Stress Management  Activity :  Progressive Muscle Relaxation.  LRT introduced the technique of progressive muscle relaxation to patients.  Patients were to follow along as LRT read script to engaged in the technique.  Education:  Stress Management, Discharge Planning.   Education Outcome: Acknowledges edcuation/In group clarification offered/Needs additional education  Clinical Observations/Feedback: Pt did not attend group.    Caroll RancherMarjette Lindy Garczynski, LRT/CTRS   Caroll RancherLindsay, Shanyia Stines A 04/02/2016 12:59 PM

## 2016-04-02 NOTE — [facility] Group Notes (Signed)
[facility] LCSW Group Therapy  04/02/2016 5:35 PM  Type of Therapy:  Group Therapy  Participation Level:  Active   Participation Quality:  Attentive  Affect:  Appropriate  Cognitive:  Alert  Insight:  Improving   Engagement in Therapy:  Improving   Modes of Intervention:  Discussion, Education, Socialization and Support  Summary of Progress/Problems: Feelings around Relapse. Group members discussed the meaning of relapse and shared personal stories of relapse, how it affected them and others, and how they perceived themselves during this time. Group members were encouraged to identify triggers, warning signs and coping skills used when facing the possibility of relapse. Social supports were discussed and explored in detail. Pt attended group and stayed the entire time. Pt discussed the passing of his mother.   Sempra EnergyCandace L Jenevieve Kirschbaum MSW, LCSWA  04/02/2016, 5:35 PM

## 2016-04-02 NOTE — Progress Notes (Signed)
Patient denies SI, HI and AVH.  Patient reported feeling like his chest is hurting, vital signs were taken and found to be within normal limits.  Patient was offered prn anxiety medications and reported relief.     Assess patient for safety offer medications as prescribed, engage patient in 1:1 staff talks.   Continue to monitor as prescribed, patient able to contract for safety.

## 2016-04-02 NOTE — [facility] Group Notes (Signed)
Late entry from 8/24:      [facility] 04/01/16  1:15 pm  Type of Therapy: Group Therapy Participation Level: Minimal  Participation Quality: Attentive  Affect: Depressed & flat  Cognitive: Alert and Oriented  Insight: Developing/Improving and Engaged  Engagement in Therapy: Developing/Improving and Engaged  Modes of Intervention: Clarification, Confrontation, Discussion, Education, Exploration,  Limit-setting, Orientation, Problem-solving, Rapport Building, Dance movement psychotherapisteality Testing, Socialization and Support  Summary of Progress/Problems: CSW and patients reviewed community resources including AA/NA groups, [employer] [place] support groups, housing resources, and grief counseling. Patients discussed which programs would be beneficial for them in their recovery. Patient participated minimally in group discussion.  Samuella BruinKristin Miguel Christiana, LCSW Clinical Social Worker [facility] [phone]

## 2016-04-02 NOTE — Progress Notes (Signed)
The patient attended this evening's A. A. Meeting and was appropriate.  

## 2016-04-03 ENCOUNTER — Encounter (HOSPITAL_COMMUNITY): Payer: Self-pay | Admitting: Registered Nurse

## 2016-04-03 DIAGNOSIS — Z833 Family history of diabetes mellitus: Secondary | ICD-10-CM

## 2016-04-03 DIAGNOSIS — F411 Generalized anxiety disorder: Secondary | ICD-10-CM

## 2016-04-03 DIAGNOSIS — Z8249 Family history of ischemic heart disease and other diseases of the circulatory system: Secondary | ICD-10-CM

## 2016-04-03 DIAGNOSIS — Z79899 Other long term (current) drug therapy: Secondary | ICD-10-CM

## 2016-04-03 DIAGNOSIS — Z791 Long term (current) use of non-steroidal anti-inflammatories (NSAID): Secondary | ICD-10-CM

## 2016-04-03 DIAGNOSIS — K5900 Constipation, unspecified: Secondary | ICD-10-CM

## 2016-04-03 LAB — GLUCOSE, CAPILLARY
GLUCOSE-CAPILLARY: 137 mg/dL — AB (ref 65–99)
Glucose-Capillary: 90 mg/dL (ref 65–99)
Glucose-Capillary: 147 mg/dL — ABNORMAL HIGH (ref 65–99)

## 2016-04-03 NOTE — Progress Notes (Signed)
D. Pt pleasant on approach, denies complaints at this time.  Positive for evening wrap up group on 400 hall.  Pt observed interacting appropriately with peers on the unit.  Pt denies SI/HI/hallucinations at this time.  A.  Support and encouragement offered  R.  Pt remains safe on the unit, will continue to monitor.

## 2016-04-03 NOTE — Progress Notes (Signed)
The patient attended this evening's A. A. Meeting and was appropriate.  

## 2016-04-03 NOTE — Progress Notes (Signed)
Adult Psychoeducational Group Note  Date:  04/03/2016 Time:  11:00 PM  Group Topic/Focus:  Wrap-Up Group:   The focus of this group is to help patients review their daily goal of treatment and discuss progress on daily workbooks.   Participation Level:  Active  Participation Quality:  Appropriate  Affect:  Anxious  Cognitive:  Appropriate  Insight: Appropriate  Engagement in Group:  Engaged  Modes of Intervention:  Discussion  Additional Comments:  Patient confirmed working to attend all groups.  Patient reported being anxious throughout the day; however having a good day. Patient identified coping skill as removing himself from a stressful situation.   Elmore GuiseSLOAN, Jousha Schwandt N 04/03/2016, 11:00 PM

## 2016-04-03 NOTE — [facility] Group Notes (Signed)
[facility] Group Notes: (Clinical Social Work)   04/03/2016      Type of Therapy:  Group Therapy   Participation Level:  Did Not Attend despite MHT prompting   Jeevan Kalla Grossman-Orr, LCSW 04/03/2016, 12:22 PM     

## 2016-04-03 NOTE — [facility] Group Notes (Signed)
[facility] Group Notes:  (Nursing--Healthy Coping Skills)  Date:  04/03/2016  Time:  0900 Type of Therapy:  Nurse Education  Participation Level:  Active  Participation Quality:  Appropriate, Attentive and Supportive  Affect:  Depressed  Cognitive:  Alert, Appropriate and Oriented  Insight:  Good  Engagement in Group:  Engaged and Supportive  Modes of Intervention:  Discussion, Education, Orientation and Support  Summary of Progress/Problems: Pt attended scheduled group on 400 hall and was reported to be engaged during session.  Ouida SillsWesseh, Lincoln Maxinlivette 04/03/2016, 0900

## 2016-04-03 NOTE — Progress Notes (Signed)
D: Pt visible in milieu at intervals during shift. Presents animated on initial contact, endorsed depression on assessment. Ambulatory with a slow but steady gait, uses cane appropriately without issues for assistance with mobility. Pt denies SI, HI, and AVH at present. Reported a good night sleep when assessed. A: Scheduled medications administered as ordered. Emotional support and encouragement provided. Q 15 minutes checks maintained for safety without incidents. R: Pt remains medication compliant., denies adverse drug reactions. Cooperative with CBG monitoring. Attended scheduled groups on 400 hall. Tolerates all PO intake well. Voice no concerns at this time. Remains safe on and off unit.

## 2016-04-03 NOTE — [facility] Group Notes (Signed)
[facility] LCSW Group Therapy  04/03/2016 10:00 until 11:00 AM  Type of Therapy:  Group Therapy  Participation Level:  Active  Participation Quality:  Attentive, Monopolizing and Sharing  Affect:  Appropriate and Excited  Cognitive:  Alert and Oriented  Insight:  Developing/Improving  Engagement in Therapy:  Developing/Improving  Modes of Intervention:  Discussion, Exploration, Rapport Building, Socialization and Support  Summary of Progress/Problems:  The main focus of today's process group was for the patient to identify ways in which they have in the past sabotaged their own recovery. Motivational Interviewing was utilized to ask the group members what they get out of their self sabotaging behavior(s), and what reasons they may have for wanting to change. The Stages of Change were explained using a handout, and patients identified where they currently are with regard to stages of change. Patient attened 400 group from 300 hall and engaged easily to the point of needing redirection to avoid isolation. Patient shared challenges of of changing his response to negative/hopeless thinking. Patient processed his lack of area supports and inability to sleep over the last year. Patient reports he is in action stage of changing his medications and in preparation stage of dealing with his negative thinking.    Carney Bernatherine C Tahj Njoku, LCSW

## 2016-04-03 NOTE — Progress Notes (Signed)
Patient ID: Samuel Simon, male   DOB: [birth year], 50 y.o.   MRN: [id number] [facility] MD Progress Note  04/03/2016 3:21 PM Samuel Simon  MRN:  [id number]   Subjective:  "I was beginning to do pretty good but it is so much drama on the unit.  You can't get anything out of the group because there is one person that is always interrupting; and with everything that is going on it has increased my stress." Patient seen by this provider and chart reviewed 04/03/16.  On evaluation:  Samuel Simon reports that levels of anxiety and stress increased related to another patient on the unit that is argumentative; interrupting groups, yelling and cursing.  Patient states that his long history of depression and at one time family could call and talk with him and it would help with settling things but over the last couple of weeks depression worsened and started to have thoughts of suicide and nothing anyone did helped.  At this time patient deniers suicidal/homicidal ideation, paranoia and psychosis (was able to smell his dead fathers presence but not since 2 nights ago).   Rates his depression and anxiety 7/10 (0/10 none and 10/10 worse). Patient states that he has been sleeping and eating without difficulty; tolerating his medication without adverse reactions; and attending/participating in group sessions.      Principal Problem: MDD (major depressive disorder), severe (HCC) Diagnosis:   Patient Active Problem List   Diagnosis Date Noted  . MDD (major depressive disorder), severe (HCC) [F32.2] 03/31/2016  . AKI (acute kidney injury) (HCC) [N17.9] 07/22/2015  . Pain in the chest [R07.9] 07/22/2015  . Chest pain [R07.9] 07/22/2015  . Normocytic hypochromic anemia [D50.9]   . Hypertriglyceridemia [E78.1] 03/01/2012  . Chest pain with moderate risk of acute coronary syndrome [R07.9] 02/28/2012  . Diabetes mellitus type 2 in obese (HCC) [E11.9, E66.9] 02/28/2012  . HTN (hypertension) [I10]  02/28/2012  . Obstructive sleep apnea [G47.33] 02/28/2012   Total Time spent with patient: 20 minutes  Past Psychiatric History: see HPI  Past Medical History:  Past Medical History:  Diagnosis Date  . Anxiety   . Back pain, chronic   . Complication of anesthesia    difficult waking   . Diabetes mellitus   . Generalized headaches   . Hypertension    Denies history of htn.   . Obstructive sleep apnea   . Suicide attempt [facility])     Past Surgical History:  Procedure Laterality Date  . CHOLECYSTECTOMY     Family History:  Family History  Problem Relation Age of Onset  . Heart attack Father   . Hypertension Father   . Heart attack Brother   . Diabetes Mother   . Hypertension Mother    Family Psychiatric  History: see HPI Social History:  History  Alcohol Use No    Comment: 1 glass wine per week/biweekly     History  Drug Use No    Social History   Social History  . Marital status: Married    Spouse name: N/A  . Number of children: N/A  . Years of education: N/A   Social History Main Topics  . Smoking status: Never Smoker  . Smokeless tobacco: Never Used  . Alcohol use No     Comment: 1 glass wine per week/biweekly  . Drug use: No  . Sexual activity: Not Asked   Other Topics Concern  . None   Social History Narrative  . None   Additional Social History:  Sleep: Good  Appetite:  Good  Current Medications: Current Facility-Administered Medications  Medication Dose Route Frequency Provider Last Rate Last Dose  . acetaminophen (TYLENOL) tablet 650 mg  650 mg Oral Q6H PRN Kerry Hough, PA-C      . alum & mag hydroxide-simeth (MAALOX/MYLANTA) 200-200-20 MG/5ML suspension 30 mL  30 mL Oral Q4H PRN Kerry Hough, PA-C      . ARIPiprazole (ABILIFY) tablet 5 mg  5 mg Oral Daily Neysa Hotter, MD   5 mg at 04/03/16 0831  . aspirin EC tablet 81 mg  81 mg Oral Daily Kerry Hough, PA-C   81 mg at 04/03/16 0831  .  diphenhydrAMINE-zinc acetate (BENADRYL) 2-0.1 % cream   Topical TID PRN Neysa Hotter, MD      . DULoxetine (CYMBALTA) DR capsule 90 mg  90 mg Oral Daily Neysa Hotter, MD   90 mg at 04/03/16 0831  . glipiZIDE (GLUCOTROL) tablet 5 mg  5 mg Oral QAC breakfast Kerry Hough, PA-C   5 mg at 04/03/16 6962  . hydrOXYzine (ATARAX/VISTARIL) tablet 25 mg  25 mg Oral Q6H PRN Kerry Hough, PA-C   25 mg at 04/02/16 2159  . insulin aspart (novoLOG) injection 0-20 Units  0-20 Units Subcutaneous TID WC Kerry Hough, PA-C   3 Units at 04/03/16 9528  . magnesium hydroxide (MILK OF MAGNESIA) suspension 30 mL  30 mL Oral Daily PRN Kerry Hough, PA-C      . traZODone (DESYREL) tablet 50 mg  50 mg Oral QHS,MR X 1 Kerry Hough, PA-C   50 mg at 04/02/16 2158    Lab Results:  Results for orders placed or performed during the hospital encounter of 03/31/16 (from the past 48 hour(s))  Glucose, capillary     Status: Abnormal   Collection Time: 04/01/16  5:08 PM  Result Value Ref Range   Glucose-Capillary 166 (H) 65 - 99 mg/dL   Comment 1 Notify RN    Comment 2 Document in Chart   Glucose, capillary     Status: Abnormal   Collection Time: 04/02/16  6:36 AM  Result Value Ref Range   Glucose-Capillary 123 (H) 65 - 99 mg/dL  Glucose, capillary     Status: None   Collection Time: 04/02/16 12:02 PM  Result Value Ref Range   Glucose-Capillary 99 65 - 99 mg/dL   Comment 1 Notify RN    Comment 2 Document in Chart   Glucose, capillary     Status: Abnormal   Collection Time: 04/02/16  5:15 PM  Result Value Ref Range   Glucose-Capillary 206 (H) 65 - 99 mg/dL   Comment 1 Notify RN    Comment 2 Document in Chart   Glucose, capillary     Status: Abnormal   Collection Time: 04/03/16  6:01 AM  Result Value Ref Range   Glucose-Capillary 137 (H) 65 - 99 mg/dL  Glucose, capillary     Status: None   Collection Time: 04/03/16 12:11 PM  Result Value Ref Range   Glucose-Capillary 90 65 - 99 mg/dL    Blood  Alcohol level:  Lab Results  Component Value Date   ETH <5 03/30/2016   ETH <5 12/30/2015    Metabolic Disorder Labs: Lab Results  Component Value Date   HGBA1C 8.1 (H) 04/01/2016   MPG 186 04/01/2016   MPG 194 07/22/2015   No results found for: PROLACTIN Lab Results  Component Value Date   CHOL 201 (H)  04/01/2016   TRIG 152 (H) 04/01/2016   HDL 46 04/01/2016   CHOLHDL 4.4 04/01/2016   VLDL 30 04/01/2016   LDLCALC 125 (H) 04/01/2016   LDLCALC 62 02/29/2012    Physical Findings: AIMS: Facial and Oral Movements Muscles of Facial Expression: None, normal Lips and Perioral Area: None, normal Jaw: None, normal Tongue: None, normal,Extremity Movements Upper (arms, wrists, hands, fingers): None, normal Lower (legs, knees, ankles, toes): None, normal, Trunk Movements Neck, shoulders, hips: None, normal, Overall Severity Severity of abnormal movements (highest score from questions above): None, normal Incapacitation due to abnormal movements: None, normal Patient's awareness of abnormal movements (rate only patient's report): No Awareness, Dental Status Current problems with teeth and/or dentures?: No Does patient usually wear dentures?: No  CIWA:    COWS:     Musculoskeletal: Strength & Muscle Tone: within normal limits Gait & Station: normal Patient leans: N/A  Psychiatric Specialty Exam: Physical Exam  Constitutional: He is oriented to person, place, and time. He appears well-developed and well-nourished.  Neurological: He is alert and oriented to person, place, and time.    Review of Systems  Skin: Positive for rash.  Psychiatric/Behavioral: Positive for depression. Negative for hallucinations, substance abuse and suicidal ideas. The patient is nervous/anxious. The patient does not have insomnia.   All other systems reviewed and are negative.   Blood pressure 122/74, pulse 82, temperature 97.8 F (36.6 C), resp. rate 18, height 5\' 9"  (1.753 m), weight 117.9 kg  (260 lb).Body mass index is 38.4 kg/m.  General Appearance: Fairly Groomed  Eye Contact:  Fair  Speech:  Clear and Coherent  Volume:  Normal  Mood:  Anxious and Depressed  Affect:  Depressed- improving  Thought Process:  Coherent and Goal Directed  Orientation:  Full (Time, Place, and Person)  Thought Content:  Logical Perceptions: denies AH/VH/OH  Suicidal Thoughts:  No-Denies at this time  Homicidal Thoughts:  No  Memory:  intact  Judgement:  Fair  Insight:  Fair  Psychomotor Activity:  Decreased  Concentration:  Concentration: Fair and Attention Span: Fair  Recall:  FiservFair  Fund of Knowledge:  Fair  Language:  Fair  Akathisia:  No  Handed:  Right  AIMS (if indicated):     Assets:  Communication Skills Desire for Improvement  ADL's:  Intact  Cognition:  WNL  Sleep:  Number of Hours: 6    Treatment Plan Summary: Daily contact with patient to assess and evaluate symptoms and progress in treatment and Medication management   Plans - Continue Cymbalta 90 mg daily for Major Depression, recurrent without psychotic features - Continue Abilify 5 mg daily MDD - Reviewed Creatinine - 1.64, baseline - Reviewed TSH; 1.369 - Medication management to reduce current symptoms to base line and improve the patient's overall level of functioning. - Monitor for the adverse effect of the medications and anger outbursts - Continue 15 minutes observation for safety concerns - Encouraged to participate in milieu therapy and group therapy counseling sessions and also work with coping skills -  Develop treatment plan to decrease risk of relapse upon discharge and to reduce the need for readmission. -  Psycho-social education regarding relapse prevention and self care. - Health care follow up as needed for medical problems. - Restart home medications where appropriate.  Rankin, Shuvon, NP 04/03/2016, 3:21 PM   Agree with NP Progress Note as above

## 2016-04-03 NOTE — [facility] Group Notes (Signed)
[facility] Group Notes:  (Nursing/MHT/Case Management/Adjunct)  Date:  04/03/2016  Time:  0930  Type of Therapy:  Nurse Education  Participation Level:  Active  Participation Quality:  Attentive  Affect:  Appropriate  Cognitive:  Appropriate  Insight:  Good  Engagement in Group:  Engaged  Modes of Intervention:  Discussion, Education and Support  Summary of Progress/Problems: Patient shared that he will "try to be better and not get upset by use of the following coping skills: coloring, writing and exercising."  Lawrence MarseillesFriedman, Demiyah Fischbach Eakes 04/03/2016, 1000

## 2016-04-04 DIAGNOSIS — K5900 Constipation, unspecified: Secondary | ICD-10-CM

## 2016-04-04 LAB — GLUCOSE, CAPILLARY
GLUCOSE-CAPILLARY: 115 mg/dL — AB (ref 65–99)
GLUCOSE-CAPILLARY: 151 mg/dL — AB (ref 65–99)
Glucose-Capillary: 84 mg/dL (ref 65–99)

## 2016-04-04 MED ORDER — DOCUSATE SODIUM 100 MG PO CAPS
100.0000 mg | ORAL_CAPSULE | Freq: Every day | ORAL | Status: DC
  Administered 2016-04-04 – 2016-04-09 (×6): 100 mg via ORAL
  Filled 2016-04-04 (×8): qty 1

## 2016-04-04 MED ORDER — MAGNESIUM CITRATE PO SOLN
1.0000 | Freq: Once | ORAL | Status: AC
  Administered 2016-04-04: 1 via ORAL

## 2016-04-04 NOTE — [facility] Group Notes (Signed)
[facility] Group Notes:  (Nursing/MHT/Case Management/Adjunct)  Date:  04/04/2016  Time:  10:20 AM  Type of Therapy:  Group Therapy  Participation Level:  Active  Participation Quality:  Appropriate  Affect:  Appropriate  Cognitive:  Appropriate  Insight:  Appropriate  Engagement in Group:  Engaged  Modes of Intervention:  Discussion, Education and Support  Summary of Progress/Problems: Samuel NeedleMichael attended group on 400 and shared that he hoped to try not to get anxious in response to "upheaval" surrounding him.  Samuel SimmeringShugart, Samuel Simon M 04/04/2016, 10:20 AM

## 2016-04-04 NOTE — Plan of Care (Signed)
Problem: Activity: Goal: Interest or engagement in leisure activities will improve Outcome: Progressing Pt did attend evening AA group   

## 2016-04-04 NOTE — Progress Notes (Signed)
D.  Pt pleasant on approach, complaint of headache noted.  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  It was reported from dayshift that Pt would like to move to 400 hall if a bed becomes available.  Pt denies SI/HI/halluicnations at this time.  Pt observed playing cards in dayroom with peers before bed.  A. Support and encouragement offered, medications given as ordered for headache pain.  R.  Pt remains safe on the unit, will continue to  Monitor.

## 2016-04-04 NOTE — [facility] Group Notes (Signed)
[facility] Group Notes:  (Clinical Social Work)  04/04/2016  10:00-11:00AM  Summary of Progress/Problems:   The main focus of today's process group was to   1)  discuss the importance of adding supports  2)  Identify the patient's current healthy supports and start thinking about what could be added             3)  Play a song that each patient feels inspired by, allowing time for explanations and reactions.  The patient expressed full comprehension of the concepts presented, and agreed that there is a need to add more supports.  The patient was very open to the idea of using music as a means of distraction from triggers.  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  Type of Therapy:  Process Group with Motivational Interviewing  Participation Level:  Active  Participation Quality:  Attentive  Affect:  Blunted  Cognitive:  Appropriate  Insight:  Engaged  Engagement in Therapy:  Engaged  Modes of Intervention:   Education, Support and Processing, Activity  Ambrose MantleMareida Grossman-Orr, LCSW 04/04/2016

## 2016-04-04 NOTE — Progress Notes (Signed)
Patient ID: Samuel Simon, male   DOB: 1965/10/24, 50 y.o.   MRN: [id number] [facility]  04/04/2016 12:24 PM Samuel Simon  MRN:  [id number]   Subjective:  "I'm still a little nervous.  I know you know about all of the drama that has been going on.  The little girl you know the one Samuel Simon the 50 yr old she was like a little net just bothersome and interruptive; but you could try to ignore. But Samuel Simon is like a Bee.  She is just violent and has these violent outburst when she is just cussing and I just get the shakes.  Last night in the cafeteria she just acted out really bad.  Samuel Simon had to get up from the table and leave.  I just don't like being around stuff like that and you never know if she is going to turn on you." Patient seen by this provider and chart reviewed 04/04/16.  On evaluation:  Samuel Simon reports that he continues to be stressed by one of the other patient behavior outburst and voices that he is afraid.  States that it is hard to concentrate on getting better when there is so much drama and disturbance all the time.  States that other than sitting in your room it hard to get away from it.   Patient also reports that he is having issues with constipation and painful stool.  Patient states that he is tolerating his medication with out adverse reaction; attending/participating in group sessions; eating and sleeping without difficulty.  At this time patient rates his depression 5/10 and anxiety "it's just up there.  I'm shaking all the time on edge."  Patient denies suicidal/homicidal ideation, psychosis, and paranoia.  Principal Problem: MDD (major depressive disorder), severe (HCC) Diagnosis:   Patient Active Problem List   Diagnosis Date Noted  . Anxiety state [F41.1]   . MDD (major depressive disorder), severe (HCC) [F32.2] 03/31/2016  . AKI (acute kidney injury) (HCC) [N17.9] 07/22/2015  . Pain in the chest [R07.9] 07/22/2015  . Chest pain [R07.9]  07/22/2015  . Normocytic hypochromic anemia [D50.9]   . Hypertriglyceridemia [E78.1] 03/01/2012  . Chest pain with moderate risk of acute coronary syndrome [R07.9] 02/28/2012  . Diabetes mellitus type 2 in obese (HCC) [E11.9, E66.9] 02/28/2012  . HTN (hypertension) [I10] 02/28/2012  . Obstructive sleep apnea [G47.33] 02/28/2012   Total Time spent with patient: 15 minutes  Past Psychiatric History: see HPI  Past Medical History:  Past Medical History:  Diagnosis Date  . Anxiety   . Back pain, chronic   . Complication of anesthesia    difficult waking   . Diabetes mellitus   . Generalized headaches   . Hypertension    Denies history of htn.   . Obstructive sleep apnea   . Suicide attempt [facility](HCC)     Past Surgical History:  Procedure Laterality Date  . CHOLECYSTECTOMY     Family History:  Family History  Problem Relation Age of Onset  . Heart attack Father   . Hypertension Father   . Heart attack Brother   . Diabetes Mother   . Hypertension Mother    Family Psychiatric  History: see HPI Social History:  History  Alcohol Use No    Comment: 1 glass wine per week/biweekly     History  Drug Use No    Social History   Social History  . Marital status: Married    Spouse name: N/A  . Number of  children: N/A  . Years of education: N/A   Social History Main Topics  . Smoking status: Never Smoker  . Smokeless tobacco: Never Used  . Alcohol use No     Comment: 1 glass wine per week/biweekly  . Drug use: No  . Sexual activity: Not Asked   Other Topics Concern  . None   Social History Narrative  . None   Additional Social History:                         Sleep: Good  Appetite:  Good  Current Medications: Current Facility-Administered Medications  Medication Dose Route Frequency Provider Last Rate Last Dose  . acetaminophen (TYLENOL) tablet 650 mg  650 mg Oral Q6H PRN Kerry Hough, PA-C      . alum & mag hydroxide-simeth (MAALOX/MYLANTA)  200-200-20 MG/5ML suspension 30 mL  30 mL Oral Q4H PRN Kerry Hough, PA-C      . ARIPiprazole (ABILIFY) tablet 5 mg  5 mg Oral Daily Neysa Hotter, MD   5 mg at 04/04/16 0755  . aspirin EC tablet 81 mg  81 mg Oral Daily Kerry Hough, PA-C   81 mg at 04/04/16 0755  . diphenhydrAMINE-zinc acetate (BENADRYL) 2-0.1 % cream   Topical TID PRN Neysa Hotter, MD      . docusate sodium (COLACE) capsule 100 mg  100 mg Oral Daily Shuvon B Rankin, NP      . DULoxetine (CYMBALTA) DR capsule 90 mg  90 mg Oral Daily Neysa Hotter, MD   90 mg at 04/04/16 0756  . glipiZIDE (GLUCOTROL) tablet 5 mg  5 mg Oral QAC breakfast Kerry Hough, PA-C   5 mg at 04/04/16 1610  . hydrOXYzine (ATARAX/VISTARIL) tablet 25 mg  25 mg Oral Q6H PRN Kerry Hough, PA-C   25 mg at 04/04/16 0758  . insulin aspart (novoLOG) injection 0-20 Units  0-20 Units Subcutaneous TID WC Kerry Hough, PA-C   3 Units at 04/03/16 1713  . magnesium citrate solution 1 Bottle  1 Bottle Oral Once Shuvon B Rankin, NP      . magnesium hydroxide (MILK OF MAGNESIA) suspension 30 mL  30 mL Oral Daily PRN Kerry Hough, PA-C      . traZODone (DESYREL) tablet 50 mg  50 mg Oral QHS,MR X 1 Kerry Hough, PA-C   50 mg at 04/03/16 2231    Lab Results:  Results for orders placed or performed during the hospital encounter of 03/31/16 (from the past 48 hour(s))  Glucose, capillary     Status: Abnormal   Collection Time: 04/02/16  5:15 PM  Result Value Ref Range   Glucose-Capillary 206 (H) 65 - 99 mg/dL   Comment 1 Notify RN    Comment 2 Document in Chart   Glucose, capillary     Status: Abnormal   Collection Time: 04/03/16  6:01 AM  Result Value Ref Range   Glucose-Capillary 137 (H) 65 - 99 mg/dL  Glucose, capillary     Status: None   Collection Time: 04/03/16 12:11 PM  Result Value Ref Range   Glucose-Capillary 90 65 - 99 mg/dL  Glucose, capillary     Status: Abnormal   Collection Time: 04/03/16  5:04 PM  Result Value Ref Range    Glucose-Capillary 147 (H) 65 - 99 mg/dL  Glucose, capillary     Status: Abnormal   Collection Time: 04/04/16  5:52 AM  Result Value Ref  Range   Glucose-Capillary 115 (H) 65 - 99 mg/dL  Glucose, capillary     Status: None   Collection Time: 04/04/16 11:45 AM  Result Value Ref Range   Glucose-Capillary 84 65 - 99 mg/dL    Blood Alcohol level:  Lab Results  Component Value Date   ETH <5 03/30/2016   ETH <5 12/30/2015    Metabolic Disorder Labs: Lab Results  Component Value Date   HGBA1C 8.1 (H) 04/01/2016   MPG 186 04/01/2016   MPG 194 07/22/2015   No results found for: PROLACTIN Lab Results  Component Value Date   CHOL 201 (H) 04/01/2016   TRIG 152 (H) 04/01/2016   HDL 46 04/01/2016   CHOLHDL 4.4 04/01/2016   VLDL 30 04/01/2016   LDLCALC 125 (H) 04/01/2016   LDLCALC 62 02/29/2012    Physical Findings: AIMS: Facial and Oral Movements Muscles of Facial Expression: None, normal Lips and Perioral Area: None, normal Jaw: None, normal Tongue: None, normal,Extremity Movements Upper (arms, wrists, hands, fingers): None, normal Lower (legs, knees, ankles, toes): None, normal, Trunk Movements Neck, shoulders, hips: None, normal, Overall Severity Severity of abnormal movements (highest score from questions above): None, normal Incapacitation due to abnormal movements: None, normal Patient's awareness of abnormal movements (rate only patient's report): No Awareness, Dental Status Current problems with teeth and/or dentures?: No Does patient usually wear dentures?: No  CIWA:    COWS:     Musculoskeletal: Strength & Muscle Tone: within normal limits Gait & Station: normal Patient leans: N/A  Psychiatric Specialty Exam: Physical Exam  Constitutional: He is oriented to person, place, and time. He appears well-developed and well-nourished.  Neurological: He is alert and oriented to person, place, and time.    Review of Systems  Gastrointestinal: Positive for constipation  (Reports that stool was hard and difficult yesterday and today unable to "get out and hurts").  Musculoskeletal: Positive for joint pain.       Uses cane for ambulation   Skin: Positive for rash.  Psychiatric/Behavioral: Positive for depression. Negative for hallucinations, substance abuse and suicidal ideas. The patient is nervous/anxious. The patient does not have insomnia.   All other systems reviewed and are negative.   Blood pressure 118/79, pulse 80, temperature 97.8 F (36.6 C), resp. rate 16, height 5\' 9"  (1.753 m), weight 117.9 kg (260 lb).Body mass index is 38.4 kg/m.  General Appearance: Fairly Groomed  Eye Contact:  Fair  Speech:  Clear and Coherent  Volume:  Normal  Mood:  Anxious and Depressed  Affect:  Depressed- continues to improving  Thought Process:  Coherent and Goal Directed  Orientation:  Full (Time, Place, and Person)  Thought Content:  Logical and Denies hallucinations, delusions, and paranoia  Suicidal Thoughts:  No  Homicidal Thoughts:  No  Memory:  intact  Judgement:  Fair  Insight:  Fair  Psychomotor Activity:  Decreased  Concentration:  Concentration: Fair and Attention Span: Fair  Recall:  [employer] of Knowledge:  Fair  Language:  Fair  Akathisia:  No  Handed:  Right  AIMS (if indicated):     Assets:  Communication Skills Desire for Improvement  ADL's:  Intact  Cognition:  WNL  Sleep:  Number of Hours: 6    Treatment Plan Summary: Daily contact with patient to assess and evaluate symptoms and progress in treatment and Medication management   Plans - Continue Cymbalta 90 mg daily for Major Depression, recurrent without psychotic features - Continue Abilify 5 mg daily MDD -  Started Colace 100 mg daily for Constipation; one bottle or Magnesium Citrates Solution given - Reviewed Creatinine - 1.64, baseline - Reviewed TSH; 1.369 - Medication management to reduce current symptoms to base line and improve the patient's overall level of  functioning. - Monitor for the adverse effect of the medications and anger outbursts - Continue 15 minutes observation for safety concerns - Encouraged to participate in milieu therapy and group therapy counseling sessions and also work with coping skills -  Develop treatment plan to decrease risk of relapse upon discharge and to reduce the need for readmission. -  Psycho-social education regarding relapse prevention and self care. - Health care follow up as needed for medical problems. - Restart home medications where appropriate.  Rankin, Shuvon, NP 04/04/2016, 12:24 PM   Agree with NP Progress Note as above

## 2016-04-04 NOTE — Progress Notes (Signed)
D: Pt A & O X3. Denies SI, HI and AVH on initial contact. Reported sleeping well with good appetite. Pt reported his depression 5/10 and  anxiety 8/10. Per pt "my depression is okay, I just feel so anxious with all the confusion, yelling and cursing on the unit; I'm on edge because I don't know if someone will turned towards me violently and it's just stressful thinking about all that instead of focusing on getting well". Stated his goal for today "Keep on improving mentally".  Observed playing card game with peers this afternoon. Continue to use cane for assistance with mobility without issues. A: Emotional support and availability provided to pt. Encouraged to voice concerns / needs. PRN Vistaril (anxiety) given with scheduled medications as ordered. One time order of Mag Citrate (1 bottle) and colace was also given for c/o constipation. Fluids encouraged as well in relation to c/o constipation. Q 15 minutes checks maintained for safety.  R: Pt attended scheduled groups on [address] hall and was reportedly engaged throughout the session. Compliant with medications, denies adverse drug reactions. Reports no result from Mag Citrate thus far. Remains safe on and off unit without falls.

## 2016-04-05 LAB — GLUCOSE, CAPILLARY
GLUCOSE-CAPILLARY: 116 mg/dL — AB (ref 65–99)
GLUCOSE-CAPILLARY: 90 mg/dL (ref 65–99)
GLUCOSE-CAPILLARY: 160 mg/dL — AB (ref 65–99)
Glucose-Capillary: 173 mg/dL — ABNORMAL HIGH (ref 65–99)

## 2016-04-05 MED ORDER — BUPROPION HCL ER (XL) 150 MG PO TB24: 150.0000 mg | ORAL_TABLET | Freq: Every day | ORAL | Status: DC

## 2016-04-05 MED ORDER — BUPROPION HCL ER (XL) 150 MG PO TB24
150.0000 mg | ORAL_TABLET | Freq: Every day | ORAL | Status: DC
  Administered 2016-04-06 – 2016-04-09 (×4): 150 mg via ORAL
  Filled 2016-04-05 (×5): qty 1

## 2016-04-05 NOTE — Plan of Care (Signed)
Problem: Medication: Goal: Compliance with prescribed medication regimen will improve Outcome: Progressing Pt is compliant with medication regimen   

## 2016-04-05 NOTE — Progress Notes (Signed)
Adult Psychoeducational Group Note  Date:  04/05/2016 Time:  10:37 AM  Group Topic/Focus:  Goals Group:   The focus of this group is to help patients establish daily goals to achieve during treatment and discuss how the patient can incorporate goal setting into their daily lives to aide in recovery.   Participation Level:  Active  Participation Quality:  Appropriate  Affect:  Appropriate  Cognitive:  Alert and Appropriate  Insight: Appropriate, Good and Improving  Engagement in Group:  Engaged  Modes of Intervention:  Discussion  Additional Comments:  Pt did participate in group today.  Pt states that he is struggling with the loss of his mother.  Pt states that [facility] is [facility]  Pt's goal is to talk to doctors and attend groups. Keva Darty R Thursa Emme 04/05/2016, 10:37 AM

## 2016-04-05 NOTE — [facility] Group Notes (Signed)
[facility] LCSW Group Therapy 04/05/2016  1:15 pm  Type of Therapy: Group Therapy Participation Level: Active  Participation Quality: Attentive, Sharing and Supportive  Affect: Blunted  Cognitive: Alert and Oriented  Insight: Developing/Improving and Engaged  Engagement in Therapy: Developing/Improving and Engaged  Modes of Intervention: Clarification, Confrontation, Discussion, Education, Exploration,  Limit-setting, Orientation, Problem-solving, Rapport Building, Dance movement psychotherapisteality Testing, Socialization and Support  Summary of Progress/Problems: Pt identified obstacles faced currently and processed barriers involved in overcoming these obstacles. Pt identified steps necessary for overcoming these obstacles and explored motivation (internal and external) for facing these difficulties head on. Pt further identified one area of concern in their lives and chose a goal to focus on for today. Patient participated in discussion of internal vs. External obstacles, as well as resiliency factors.   Samuella BruinKristin Dezmond Downie, LCSW Clinical Social Worker [facility] [phone]

## 2016-04-05 NOTE — Progress Notes (Signed)
Patient ID: Samuel Simon, male   DOB: [birth year], 50 y.o.   MRN: [id number] [facility] MD Progress Note  04/05/2016 2:40 PM Rodrecus Ferraris  MRN:  [id number] Subjective:   He states that he had insomnia last night. He feels "nervous" about other peers' disruptive behaviors in the unit. He states that although he would normally would not be bothered by it, he feels more sensitive. There was a time he screamed back on other peer. He thought of his mother (without any trigger) last night and had ruminated on it. He reports medication (trazodone or hydroxyzine) helped for his insomnia. He had passive SI last night but denies SI today. He has seen some visual image of his mother but denies other VH.   Principal Problem: MDD (major depressive disorder), severe (HCC) Diagnosis:   Patient Active Problem List   Diagnosis Date Noted  . Constipation [K59.00]   . Anxiety state [F41.1]   . MDD (major depressive disorder), severe (HCC) [F32.2] 03/31/2016  . AKI (acute kidney injury) (HCC) [N17.9] 07/22/2015  . Pain in the chest [R07.9] 07/22/2015  . Chest pain [R07.9] 07/22/2015  . Normocytic hypochromic anemia [D50.9]   . Hypertriglyceridemia [E78.1] 03/01/2012  . Chest pain with moderate risk of acute coronary syndrome [R07.9] 02/28/2012  . Diabetes mellitus type 2 in obese (HCC) [E11.9, E66.9] 02/28/2012  . HTN (hypertension) [I10] 02/28/2012  . Obstructive sleep apnea [G47.33] 02/28/2012   Total Time spent with patient: 20 minutes  Past Psychiatric History: see HPI  Past Medical History:  Past Medical History:  Diagnosis Date  . Anxiety   . Back pain, chronic   . Complication of anesthesia    difficult waking   . Diabetes mellitus   . Generalized headaches   . Hypertension    Denies history of htn.   . Obstructive sleep apnea   . Suicide attempt [facility])     Past Surgical History:  Procedure Laterality Date  . CHOLECYSTECTOMY     Family History:  Family History  Problem Relation  Age of Onset  . Heart attack Father   . Hypertension Father   . Heart attack Brother   . Diabetes Mother   . Hypertension Mother    Family Psychiatric  History: see HPI Social History:  History  Alcohol Use No    Comment: 1 glass wine per week/biweekly     History  Drug Use No    Social History   Social History  . Marital status: Married    Spouse name: N/A  . Number of children: N/A  . Years of education: N/A   Social History Main Topics  . Smoking status: Never Smoker  . Smokeless tobacco: Never Used  . Alcohol use No     Comment: 1 glass wine per week/biweekly  . Drug use: No  . Sexual activity: Not Asked   Other Topics Concern  . None   Social History Narrative  . None   Additional Social History:                         Sleep: Good  Appetite:  Good  Current Medications: Current Facility-Administered Medications  Medication Dose Route Frequency Provider Last Rate Last Dose  . acetaminophen (TYLENOL) tablet 650 mg  650 mg Oral Q6H PRN Kerry Hough, PA-C   650 mg at 04/05/16 7829  . alum & mag hydroxide-simeth (MAALOX/MYLANTA) 200-200-20 MG/5ML suspension 30 mL  30 mL Oral Q4H PRN Kerry Hough,  PA-C      . ARIPiprazole (ABILIFY) tablet 5 mg  5 mg Oral Daily Neysa Hotter, MD   5 mg at 04/05/16 0829  . aspirin EC tablet 81 mg  81 mg Oral Daily Kerry Hough, PA-C   81 mg at 04/05/16 1610  . diphenhydrAMINE-zinc acetate (BENADRYL) 2-0.1 % cream   Topical TID PRN Neysa Hotter, MD      . docusate sodium (COLACE) capsule 100 mg  100 mg Oral Daily Shuvon B Rankin, NP   100 mg at 04/05/16 0829  . DULoxetine (CYMBALTA) DR capsule 90 mg  90 mg Oral Daily Neysa Hotter, MD   90 mg at 04/05/16 0829  . glipiZIDE (GLUCOTROL) tablet 5 mg  5 mg Oral QAC breakfast Kerry Hough, PA-C   5 mg at 04/05/16 9604  . hydrOXYzine (ATARAX/VISTARIL) tablet 25 mg  25 mg Oral Q6H PRN Kerry Hough, PA-C   25 mg at 04/05/16 0831  . insulin aspart (novoLOG) injection  0-20 Units  0-20 Units Subcutaneous TID WC Kerry Hough, PA-C   4 Units at 04/04/16 1721  . magnesium hydroxide (MILK OF MAGNESIA) suspension 30 mL  30 mL Oral Daily PRN Kerry Hough, PA-C      . traZODone (DESYREL) tablet 50 mg  50 mg Oral QHS,MR X 1 Kerry Hough, PA-C   50 mg at 04/04/16 2208    Lab Results:  Results for orders placed or performed during the hospital encounter of 03/31/16 (from the past 48 hour(s))  Glucose, capillary     Status: Abnormal   Collection Time: 04/03/16  5:04 PM  Result Value Ref Range   Glucose-Capillary 147 (H) 65 - 99 mg/dL  Glucose, capillary     Status: Abnormal   Collection Time: 04/04/16  5:52 AM  Result Value Ref Range   Glucose-Capillary 115 (H) 65 - 99 mg/dL  Glucose, capillary     Status: None   Collection Time: 04/04/16 11:45 AM  Result Value Ref Range   Glucose-Capillary 84 65 - 99 mg/dL  Glucose, capillary     Status: Abnormal   Collection Time: 04/04/16  4:42 PM  Result Value Ref Range   Glucose-Capillary 151 (H) 65 - 99 mg/dL  Glucose, capillary     Status: Abnormal   Collection Time: 04/05/16  6:15 AM  Result Value Ref Range   Glucose-Capillary 116 (H) 65 - 99 mg/dL  Glucose, capillary     Status: None   Collection Time: 04/05/16 12:13 PM  Result Value Ref Range   Glucose-Capillary 90 65 - 99 mg/dL   Comment 1 Notify RN     Blood Alcohol level:  Lab Results  Component Value Date   ETH <5 03/30/2016   ETH <5 12/30/2015    Metabolic Disorder Labs: Lab Results  Component Value Date   HGBA1C 8.1 (H) 04/01/2016   MPG 186 04/01/2016   MPG 194 07/22/2015   No results found for: PROLACTIN Lab Results  Component Value Date   CHOL 201 (H) 04/01/2016   TRIG 152 (H) 04/01/2016   HDL 46 04/01/2016   CHOLHDL 4.4 04/01/2016   VLDL 30 04/01/2016   LDLCALC 125 (H) 04/01/2016   LDLCALC 62 02/29/2012    Physical Findings: AIMS: Facial and Oral Movements Muscles of Facial Expression: None, normal Lips and Perioral  Area: None, normal Jaw: None, normal Tongue: None, normal,Extremity Movements Upper (arms, wrists, hands, fingers): None, normal Lower (legs, knees, ankles, toes): None, normal, Trunk Movements  Neck, shoulders, hips: None, normal, Overall Severity Severity of abnormal movements (highest score from questions above): None, normal Incapacitation due to abnormal movements: None, normal Patient's awareness of abnormal movements (rate only patient's report): No Awareness, Dental Status Current problems with teeth and/or dentures?: No Does patient usually wear dentures?: No  CIWA:    COWS:     Musculoskeletal: Strength & Muscle Tone: within normal limits Gait & Station: normal Patient leans: N/A  Psychiatric Specialty Exam: Physical Exam  Constitutional: He is oriented to person, place, and time. He appears well-developed and well-nourished.  Neurological: He is alert and oriented to person, place, and time.    Review of Systems  Gastrointestinal: Negative for constipation.  Skin: Positive for rash.  Neurological: Positive for headaches. Negative for dizziness.  Psychiatric/Behavioral: Positive for depression. Negative for hallucinations, substance abuse and suicidal ideas. The patient is nervous/anxious and has insomnia.   All other systems reviewed and are negative.   Blood pressure 126/75, pulse 82, temperature 97.5 F (36.4 C), temperature source Oral, resp. rate 16, height 5\' 9"  (1.753 m), weight 260 lb (117.9 kg).Body mass index is 38.4 kg/m.  General Appearance: Fairly Groomed  Eye Contact:  Fair  Speech:  Clear and Coherent  Volume:  Normal  Mood:  Depressed  Affect:  Depressed and Restricted- improving  Thought Process:  Coherent and Goal Directed  Orientation:  Full (Time, Place, and Person)  Thought Content:  Logical Perceptions: denies AH/VH  Suicidal Thoughts:  No  Homicidal Thoughts:  No  Memory:  intact  Judgement:  Fair  Insight:  Fair  Psychomotor Activity:   Decreased  Concentration:  Concentration: Fair and Attention Span: Fair  Recall:  [employer]  Fund of Knowledge:  Fair  Language:  Fair  Akathisia:  No  Handed:  Right  AIMS (if indicated):     Assets:  Communication Skills Desire for Improvement  ADL's:  Intact  Cognition:  WNL  Sleep:  Number of Hours: 6.5   Assessment Mr. [name] is a 50 year old male with depression, chronic pain, hypertension, diabetes, obstructive sleep apnea, temporal arteritis per patient report who presented to the ED for worsening suicidal ideation with plan to cutting his wrist. Psychosocial stressors including worsening pain, marital discordance and recent death of his mother.   # MDD, severe, recurrent, without psychotic features He continues to endorse neurovegetative symptoms with little improvement.  Will add bupropion given his anhedonia and very low energy. Continue other meds.   Plans - Continue Cymbalta 90 mg daily - Start bupropion ER 150 mg daily  - Continue Abilify 5 mg daily - Reviewed Creatinine - 1.64, baseline - Reviewed TSH; 1.369 - Medication management to reduce current symptoms to base line and improve the patient's overall level of functioning. - Monitor for the adverse effect of the medications and anger outbursts - Continue 15 minutes observation for safety concerns - Encouraged to participate in milieu therapy and group therapy counseling sessions and also work with coping skills -  Develop treatment plan to decrease risk of relapse upon discharge and to reduce the need for readmission. -  Psycho-social education regarding relapse prevention and self care. - Health care follow up as needed for medical problems. - Restart home medications where appropriate.  Treatment Plan Summary: Daily contact with patient to assess and evaluate symptoms and progress in treatment  Neysa Hottereina Chevelle Durr, MD 04/05/2016, 2:40 PM

## 2016-04-05 NOTE — Progress Notes (Signed)
Adult Psychoeducational Group Note  Date:  04/05/2016 Time:  9:36 PM  Group Topic/Focus:  Wrap-Up Group:   The focus of this group is to help patients review their daily goal of treatment and discuss progress on daily workbooks.   Participation Level:  Active  Participation Quality:  Appropriate  Affect:  Appropriate  Cognitive:  Alert  Insight: Appropriate  Engagement in Group:  Engaged  Modes of Intervention:  Discussion  Additional Comments:  Patient states, "my day started off crappy. Been depressed all day because I thought about the passing of my mom". Patient also states he went outside and it took his mind off of it.  Ryota Treece L Makaio Mach 04/05/2016, 9:36 PM

## 2016-04-05 NOTE — Progress Notes (Signed)
Recreation Therapy Notes  Date: 04/05/16 Time: 0930 Location: [address] Dayroom  Group Topic: Stress Management  Goal Area(s) Addresses:  Patient will verbalize importance of using healthy stress management.  Patient will identify positive emotions associated with healthy stress management.   Intervention: Stress Management  Activity :  [place].  LRT introduced to the technique of guided imagery to the patients.  Patients were to follow along as LRT read script in order to participate in the the technique.  Education: Stress Management, Discharge Planning.   Education Outcome: Acknowledges edcuation/In group clarification offered/Needs additional education  Clinical Observations/Feedback: Pt did not attend group.    Caroll RancherMarjette Dany Harten, LRT/CTRS    Caroll RancherLindsay, Latina Frank A 04/05/2016 12:47 PM

## 2016-04-05 NOTE — Progress Notes (Signed)
Nursing Note 04/05/2016 [id number]  Data Reports sleeping well.  Rates depression 9/10, hopelessness 7/10, and anxiety 8/10. Affect depressed mood "depressed and anxious."  Complains of anxiety, depression, hopelessness.  C/O chronic lower back pain and headache.  Denies HI and AVH.  Reports increase in suicidal ideation- passive with no plan or intent.   Reports continued grief, with intensity of SI beginning last night when he was thinking about his mother who has recently deceased.  Reports occasional olfactory hallucination stating, "I can smell my dad's workshop.  It's nothing bad, it actually makes me feel better."     Action Spoke with patient 1:1, nurse offered support to patient throughout shift.  Given PRN tylenol for back pain. Continues to be monitored on 15 minute checks for safety.  Response Tylenol minimally effective for back pain/headache. Remains safe and appropriate on unit.

## 2016-04-06 LAB — GLUCOSE, CAPILLARY
GLUCOSE-CAPILLARY: 111 mg/dL — AB (ref 65–99)
GLUCOSE-CAPILLARY: 93 mg/dL (ref 65–99)
GLUCOSE-CAPILLARY: 130 mg/dL — AB (ref 65–99)

## 2016-04-06 MED ORDER — IBUPROFEN 200 MG PO TABS
ORAL_TABLET | ORAL | Status: AC
  Filled 2016-04-06: qty 1

## 2016-04-06 MED ORDER — IBUPROFEN 200 MG PO TABS
200.0000 mg | ORAL_TABLET | Freq: Four times a day (QID) | ORAL | Status: DC | PRN
  Administered 2016-04-06: 200 mg via ORAL

## 2016-04-06 NOTE — Progress Notes (Signed)
Patient ID: Samuel Simon, male   DOB: [birth year], 50 y.o.   MRN: [id number] [facility] MD Progress Note  04/06/2016 10:31 AM Samuel Simon  MRN:  [id number] Subjective:   He states that he continues to feel nervous about other peer's behaviors of screaming in the unit. He wonders if those remind him of his wife. He feels that he is still not back to his baseline, feeling depressed. He has been trying to distract himself by coloring, but it has been difficult due to his headache. He is hoping to go to [place] for family trip with his sister.   Principal Problem: MDD (major depressive disorder), severe (HCC) Diagnosis:   Patient Active Problem List   Diagnosis Date Noted  . Constipation [K59.00]   . Anxiety state [F41.1]   . MDD (major depressive disorder), severe (HCC) [F32.2] 03/31/2016  . AKI (acute kidney injury) (HCC) [N17.9] 07/22/2015  . Pain in the chest [R07.9] 07/22/2015  . Chest pain [R07.9] 07/22/2015  . Normocytic hypochromic anemia [D50.9]   . Hypertriglyceridemia [E78.1] 03/01/2012  . Chest pain with moderate risk of acute coronary syndrome [R07.9] 02/28/2012  . Diabetes mellitus type 2 in obese (HCC) [E11.9, E66.9] 02/28/2012  . HTN (hypertension) [I10] 02/28/2012  . Obstructive sleep apnea [G47.33] 02/28/2012   Total Time spent with patient: 20 minutes  Past Psychiatric History: see HPI  Past Medical History:  Past Medical History:  Diagnosis Date  . Anxiety   . Back pain, chronic   . Complication of anesthesia    difficult waking   . Diabetes mellitus   . Generalized headaches   . Hypertension    Denies history of htn.   . Obstructive sleep apnea   . Suicide attempt [facility])     Past Surgical History:  Procedure Laterality Date  . CHOLECYSTECTOMY     Family History:  Family History  Problem Relation Age of Onset  . Heart attack Father   . Hypertension Father   . Heart attack Brother   . Diabetes Mother   . Hypertension Mother    Family  Psychiatric  History: see HPI Social History:  History  Alcohol Use No    Comment: 1 glass wine per week/biweekly     History  Drug Use No    Social History   Social History  . Marital status: Married    Spouse name: N/A  . Number of children: N/A  . Years of education: N/A   Social History Main Topics  . Smoking status: Never Smoker  . Smokeless tobacco: Never Used  . Alcohol use No     Comment: 1 glass wine per week/biweekly  . Drug use: No  . Sexual activity: Not Asked   Other Topics Concern  . None   Social History Narrative  . None   Additional Social History:                         Sleep: Good  Appetite:  Good  Current Medications: Current Facility-Administered Medications  Medication Dose Route Frequency Provider Last Rate Last Dose  . acetaminophen (TYLENOL) tablet 650 mg  650 mg Oral Q6H PRN Kerry Hough, PA-C   650 mg at 04/05/16 7829  . alum & mag hydroxide-simeth (MAALOX/MYLANTA) 200-200-20 MG/5ML suspension 30 mL  30 mL Oral Q4H PRN Kerry Hough, PA-C      . ARIPiprazole (ABILIFY) tablet 5 mg  5 mg Oral Daily Neysa Hotter, MD   5  mg at 04/06/16 0801  . aspirin EC tablet 81 mg  81 mg Oral Daily Kerry HoughSpencer E Simon, PA-C   81 mg at 04/06/16 0801  . buPROPion (WELLBUTRIN XL) 24 hr tablet 150 mg  150 mg Oral Daily Neysa Hottereina Pearlene Teat, MD   150 mg at 04/06/16 0801  . diphenhydrAMINE-zinc acetate (BENADRYL) 2-0.1 % cream   Topical TID PRN Neysa Hottereina Shequila Neglia, MD      . docusate sodium (COLACE) capsule 100 mg  100 mg Oral Daily Shuvon B Rankin, NP   100 mg at 04/06/16 0801  . DULoxetine (CYMBALTA) DR capsule 90 mg  90 mg Oral Daily Neysa Hottereina Kenzee Bassin, MD   90 mg at 04/06/16 0800  . glipiZIDE (GLUCOTROL) tablet 5 mg  5 mg Oral QAC breakfast Kerry HoughSpencer E Simon, PA-C   5 mg at 04/06/16 [id number]  . hydrOXYzine (ATARAX/VISTARIL) tablet 25 mg  25 mg Oral Q6H PRN Kerry HoughSpencer E Simon, PA-C   25 mg at 04/05/16 0831  . insulin aspart (novoLOG) injection 0-20 Units  0-20 Units  Subcutaneous TID WC Kerry HoughSpencer E Simon, PA-C   4 Units at 04/05/16 1751  . magnesium hydroxide (MILK OF MAGNESIA) suspension 30 mL  30 mL Oral Daily PRN Kerry HoughSpencer E Simon, PA-C      . traZODone (DESYREL) tablet 50 mg  50 mg Oral QHS,MR X 1 Kerry HoughSpencer E Simon, PA-C   50 mg at 04/05/16 2217    Lab Results:  Results for orders placed or performed during the hospital encounter of 03/31/16 (from the past 48 hour(s))  Glucose, capillary     Status: None   Collection Time: 04/04/16 11:45 AM  Result Value Ref Range   Glucose-Capillary 84 65 - 99 mg/dL  Glucose, capillary     Status: Abnormal   Collection Time: 04/04/16  4:42 PM  Result Value Ref Range   Glucose-Capillary 151 (H) 65 - 99 mg/dL  Glucose, capillary     Status: Abnormal   Collection Time: 04/05/16  6:15 AM  Result Value Ref Range   Glucose-Capillary 116 (H) 65 - 99 mg/dL  Glucose, capillary     Status: None   Collection Time: 04/05/16 12:13 PM  Result Value Ref Range   Glucose-Capillary 90 65 - 99 mg/dL   Comment 1 Notify RN   Glucose, capillary     Status: Abnormal   Collection Time: 04/05/16  5:49 PM  Result Value Ref Range   Glucose-Capillary 173 (H) 65 - 99 mg/dL  Glucose, capillary     Status: Abnormal   Collection Time: 04/05/16  9:16 PM  Result Value Ref Range   Glucose-Capillary 160 (H) 65 - 99 mg/dL   Comment 1 Notify RN   Glucose, capillary     Status: Abnormal   Collection Time: 04/06/16  6:05 AM  Result Value Ref Range   Glucose-Capillary 111 (H) 65 - 99 mg/dL    Blood Alcohol level:  Lab Results  Component Value Date   ETH <5 03/30/2016   ETH <5 12/30/2015    Metabolic Disorder Labs: Lab Results  Component Value Date   HGBA1C 8.1 (H) 04/01/2016   MPG 186 04/01/2016   MPG 194 07/22/2015   No results found for: PROLACTIN Lab Results  Component Value Date   CHOL 201 (H) 04/01/2016   TRIG 152 (H) 04/01/2016   HDL 46 04/01/2016   CHOLHDL 4.4 04/01/2016   VLDL 30 04/01/2016   LDLCALC 125 (H) 04/01/2016    LDLCALC 62 02/29/2012    Physical Findings:  AIMS: Facial and Oral Movements Muscles of Facial Expression: None, normal Lips and Perioral Area: None, normal Jaw: None, normal Tongue: None, normal,Extremity Movements Upper (arms, wrists, hands, fingers): None, normal Lower (legs, knees, ankles, toes): None, normal, Trunk Movements Neck, shoulders, hips: None, normal, Overall Severity Severity of abnormal movements (highest score from questions above): None, normal Incapacitation due to abnormal movements: None, normal Patient's awareness of abnormal movements (rate only patient's report): No Awareness, Dental Status Current problems with teeth and/or dentures?: No Does patient usually wear dentures?: No  CIWA:    COWS:     Musculoskeletal: Strength & Muscle Tone: within normal limits Gait & Station: normal Patient leans: N/A  Psychiatric Specialty Exam: Physical Exam  Constitutional: He is oriented to person, place, and time. He appears well-developed and well-nourished.  Neurological: He is alert and oriented to person, place, and time.    Review of Systems  Gastrointestinal: Negative for constipation.  Musculoskeletal: Positive for back pain.  Skin: Positive for rash.  Neurological: Positive for headaches. Negative for dizziness.  Psychiatric/Behavioral: Positive for depression. Negative for hallucinations, substance abuse and suicidal ideas. The patient is nervous/anxious and has insomnia.   All other systems reviewed and are negative.   Blood pressure 111/73, pulse (!) 115, temperature 98.2 F (36.8 C), temperature source Oral, resp. rate 16, height 5\' 9"  (1.753 m), weight 260 lb (117.9 kg).Body mass index is 38.4 kg/m.  General Appearance: Fairly Groomed  Eye Contact:  Fair  Speech:  Clear and Coherent  Volume:  Normal  Mood:  Depressed  Affect:  Depressed and Restricted  Thought Process:  Coherent and Goal Directed  Orientation:  Full (Time, Place, and Person)   Thought Content:  Logical Perceptions: denies AH/VH  Suicidal Thoughts:  No  Homicidal Thoughts:  No  Memory:  intact  Judgement:  Fair  Insight:  Fair  Psychomotor Activity:  Decreased  Concentration:  Concentration: Fair and Attention Span: Fair  Recall:  [employer] of Knowledge:  Fair  Language:  Fair  Akathisia:  No  Handed:  Right  AIMS (if indicated):     Assets:  Communication Skills Desire for Improvement  ADL's:  Intact  Cognition:  WNL  Sleep:  Number of Hours: 5.5   Assessment Mr. Samuel Simon) is a 50 year old male with depression, chronic pain, hypertension, diabetes, obstructive sleep apnea, temporal arteritis per patient report who presented to the ED for worsening suicidal ideation with plan to cutting his wrist. Psychosocial stressors including worsening pain, marital discordance and recent death of his mother.   # MDD, severe, recurrent, without psychotic features There has been some improvement in his neurovegetative symptoms. Will continue current medication. Behavioral activation has been discussed.  Plans - Continue Cymbalta 90 mg daily - Continue bupropion ER 150 mg daily  - Continue Abilify 5 mg daily - Reviewed Creatinine - 1.64, baseline - Reviewed TSH; 1.369 - Medication management to reduce current symptoms to base line and improve the patient's overall level of functioning. - Monitor for the adverse effect of the medications and anger outbursts - Continue 15 minutes observation for safety concerns - Encouraged to participate in milieu therapy and group therapy counseling sessions and also work with coping skills -  Develop treatment plan to decrease risk of relapse upon discharge and to reduce the need for readmission. -  Psycho-social education regarding relapse prevention and self care. - Health care follow up as needed for medical problems. - Restart home medications where appropriate.  Treatment Plan Summary: Daily contact with patient  to assess and evaluate symptoms and progress in treatment  Neysa Hotter, MD 04/06/2016, 10:31 AM

## 2016-04-06 NOTE — Progress Notes (Signed)
Patient ID: Champ Benett, male   DOB: 04-15-1966, 50 y.o.   MRN: [id number]   Pt currently presents with a masked affect and silly behavior. Pt reports to writer that their goal is to "get the numbers for my family so I can reach out to my brother who just got out of surgery." Pt states "you can call me hot chocolate, everyone will know who I am if you ask for me by that." Pt reports good sleep with current medication regimen.   Pt provided with medications per providers orders, as needed sleep medication not needed. Pt's labs and vitals were monitored throughout the night. Pt supported emotionally and encouraged to express concerns and questions. Pt educated on medications. With permission of pt, contacts checked and written down for patient.   Pt's safety ensured with 15 minute and environmental checks. Pt currently denies SI/HI and A/V hallucinations. Pt verbally agrees to seek staff if SI/HI or A/VH occurs and to consult with staff before acting on any harmful thoughts. Will continue POC.

## 2016-04-06 NOTE — Progress Notes (Signed)
Recreation Therapy Notes  Animal-Assisted Activity (AAA) Program Checklist/Progress Notes Patient Eligibility Criteria Checklist & Daily Group note for Rec TxIntervention  Date: 08.29.2017 Time: 2:45pm [facility]: [address]   AAA/T Program Assumption of Risk Form signed by Patient/ or Parent Legal Guardian Yes  Patient is free of allergies or sever asthma Yes  Patient reports no fear of animals Yes  Patient reports no history of cruelty to animals Yes  Patient understands his/her participation is voluntary Yes  Patient washes hands before animal contact Yes  Patient washes hands after animal contact Yes  Behavioral Response: Engaged, Attentive, Appropriate   Education:Hand Washing, Appropriate Animal Interaction   Education Outcome: Acknowledges education.   Clinical Observations/Feedback: Patient attended session and interacted appropriately with therapy dog and peers.   Marykay Lexenise L Monda Chastain, LRT/CTRS  Vanya Carberry L 04/06/2016 2:57 PM

## 2016-04-06 NOTE — [facility] Group Notes (Signed)
Pt attended spiritual care group on grief and loss facilitated by chaplain Octavius Shin   Group opened with brief discussion and psycho-social ed around grief and loss in relationships and in relation to self - identifying life patterns, circumstances, changes that cause losses. Established group norm of speaking from own life experience. Group goal of establishing open and affirming space for members to share loss and experience with grief, normalize grief experience and provide psycho social education and grief support.     Group facilitation drew on Narrative, Adlerian and brief CBT  

## 2016-04-06 NOTE — Progress Notes (Signed)
Adult Psychoeducational Group Note  Date:  04/06/2016 Time:  9:52 PM  Group Topic/Focus:  Wrap-Up Group:   The focus of this group is to help patients review their daily goal of treatment and discuss progress on daily workbooks.   Participation Level:  Active  Participation Quality:  Appropriate  Affect:  Appropriate  Cognitive:  Alert  Insight: Appropriate  Engagement in Group:  Engaged  Modes of Intervention:  Discussion  Additional Comments:  On a scale between 1-10, (1=worse, 10=best) patient rated his day a 7. Patient states his day started off lousy because of headache, but eventually got better. Patient's goal for today was "to make it a better day than yesterday".  Uchenna Rappaport L Allaya Abbasi 04/06/2016, 9:52 PM

## 2016-04-06 NOTE — Progress Notes (Signed)
[facility] Group Notes:  (Nursing/MHT/Case Management/Adjunct)  Date:  04/06/2016  Time:  10:55 PM  Type of Therapy:  client did not attend [address] group  Participation Level:  Did Not Attend  Participation Quality:  client did not attend [address] group  Affect:  client did not attend [address] group  Cognitive:  client did not attend [address] group  Insight:  None  Engagement in Group:  client did not attend 300 [address] group  Modes of Intervention:  client did not attend 300 [address] group  Summary of Progress/Problems:  Lyndee HensenGoins, Torrie Namba R 04/06/2016, 10:55 PM

## 2016-04-06 NOTE — Plan of Care (Signed)
Problem: Activity: Goal: Interest or engagement in leisure activities will improve Outcome: Progressing Pt plays cards with peers in the dayroom, seen laughing with others

## 2016-04-06 NOTE — Tx Team (Signed)
Interdisciplinary Treatment and Diagnostic Plan Update  04/06/2016 Time of Session: 9:30am Samuel Simon MRN: [id number]  Principal Diagnosis: MDD (major depressive disorder), severe (HCC)  Secondary Diagnoses: Principal Problem:   MDD (major depressive disorder), severe (HCC) Active Problems:   Anxiety state   Constipation   Current Medications:  Current Facility-Administered Medications  Medication Dose Route Frequency Provider Last Rate Last Dose  . acetaminophen (TYLENOL) tablet 650 mg  650 mg Oral Q6H PRN Kerry Hough, PA-C   650 mg at 04/05/16 4098  . alum & mag hydroxide-simeth (MAALOX/MYLANTA) 200-200-20 MG/5ML suspension 30 mL  30 mL Oral Q4H PRN Kerry Hough, PA-C      . ARIPiprazole (ABILIFY) tablet 5 mg  5 mg Oral Daily Neysa Hotter, MD   5 mg at 04/06/16 0801  . aspirin EC tablet 81 mg  81 mg Oral Daily Kerry Hough, PA-C   81 mg at 04/06/16 0801  . buPROPion (WELLBUTRIN XL) 24 hr tablet 150 mg  150 mg Oral Daily Neysa Hotter, MD   150 mg at 04/06/16 0801  . diphenhydrAMINE-zinc acetate (BENADRYL) 2-0.1 % cream   Topical TID PRN Neysa Hotter, MD      . docusate sodium (COLACE) capsule 100 mg  100 mg Oral Daily Shuvon B Rankin, NP   100 mg at 04/06/16 0801  . DULoxetine (CYMBALTA) DR capsule 90 mg  90 mg Oral Daily Neysa Hotter, MD   90 mg at 04/06/16 0800  . glipiZIDE (GLUCOTROL) tablet 5 mg  5 mg Oral QAC breakfast Kerry Hough, PA-C   5 mg at 04/06/16 1191  . hydrOXYzine (ATARAX/VISTARIL) tablet 25 mg  25 mg Oral Q6H PRN Kerry Hough, PA-C   25 mg at 04/05/16 0831  . insulin aspart (novoLOG) injection 0-20 Units  0-20 Units Subcutaneous TID WC Kerry Hough, PA-C   4 Units at 04/05/16 1751  . magnesium hydroxide (MILK OF MAGNESIA) suspension 30 mL  30 mL Oral Daily PRN Kerry Hough, PA-C      . traZODone (DESYREL) tablet 50 mg  50 mg Oral QHS,MR X 1 Kerry Hough, PA-C   50 mg at 04/05/16 2217   PTA Medications: Prescriptions Prior to  Admission  Medication Sig Dispense Refill Last Dose  . aspirin EC 81 MG tablet Take 81 mg by mouth daily.   12/30/2015  . cholecalciferol (VITAMIN D) 1000 units tablet Take 1,000 Units by mouth daily.   12/30/2015  . DULoxetine (CYMBALTA) 60 MG capsule Take 60 mg by mouth daily.     Marland Kitchen glipiZIDE (GLUCOTROL) 5 MG tablet Take 5 mg by mouth daily before breakfast.   12/30/2015  . lisinopril (PRINIVIL,ZESTRIL) 20 MG tablet Take 20 mg by mouth daily. Patient states he does not have HTN, he takes lisinopril for kidney protection from diabetes.   12/30/2015  . topiramate (TOPAMAX) 25 MG capsule Take 100 mg by mouth at bedtime.      . naproxen (NAPROSYN) 500 MG tablet Take 1 tablet (500 mg total) by mouth 2 (two) times daily. (Patient not taking: Reported on 03/31/2016) 30 tablet 0 Not Taking at Unknown time    Treatment Modalities: Medication Management, Group therapy, Case management,  1 to 1 session with clinician, Psychoeducation, Recreational therapy.   Physician Treatment Plan for Primary Diagnosis: MDD (major depressive disorder), severe (HCC) Long Term Goal(s): Improvement in symptoms so as ready for discharge   Short Term Goals: Ability to verbalize feelings will improve and Ability to disclose  and discuss suicidal ideas  Medication Management: Evaluate patient's response, side effects, and tolerance of medication regimen.  Therapeutic Interventions: 1 to 1 sessions, Unit Group sessions and Medication administration.  Evaluation of Outcomes: Adequate for Discharge  RN Treatment Plan for Primary Diagnosis: MDD (major depressive disorder), severe (HCC) Long Term Goal(s): Knowledge of disease and therapeutic regimen to maintain health will improve  Short Term Goals: Ability to participate in decision making will improve and Ability to disclose and discuss suicidal ideas  Medication Management: RN will administer medications as ordered by provider, will assess and evaluate patient's response  and provide education to patient for prescribed medication. RN will report any adverse and/or side effects to prescribing provider.  Therapeutic Interventions: 1 on 1 counseling sessions, Psychoeducation, Medication administration, Evaluate responses to treatment, Monitor vital signs and CBGs as ordered, Perform/monitor CIWA, COWS, AIMS and Fall Risk screenings as ordered, Perform wound care treatments as ordered.  Evaluation of Outcomes: Adequate for Discharge   LCSW Treatment Plan for Primary Diagnosis: MDD (major depressive disorder), severe (HCC) Long Term Goal(s): Safe transition to appropriate next level of care at discharge, Engage patient in therapeutic group addressing interpersonal concerns.  Short Term Goals: Engage patient in aftercare planning with referrals and resources, Increase emotional regulation and Increase skills for wellness and recovery  Therapeutic Interventions: Assess for all discharge needs, 1 to 1 time with Social worker, Explore available resources and support systems, Assess for adequacy in community support network, Educate family and significant other(s) on suicide prevention, Complete Psychosocial Assessment, Interpersonal group therapy.  Evaluation of Outcomes: Adequate for Discharge   Progress in Treatment : Attending groups: Yes  Participating in groups: Yes  Taking medication as prescribed: Yes, MD continuing to assess for appropriate medication regimen  Toleration medication: Yes  Family/Significant other contact made: Treatment team assessing for appropriate contacts  Patient understands diagnosis: Yes  Discussing patient identified problems/goals with staff: Yes  Medical problems stabilized or resolved: Yes  Denies suicidal/homicidal ideation: Yes, patient denies  Issues/concerns per patient self-inventory: None reported  Other: N/A  New problem(s) identified: None reported at this time    New Short Term/Long Term Goal(s): None at  this time    Discharge Plan or Barriers: Patient plans to return home to follow up with outpatient services.     Reason for Continuation of Hospitalization:  Anxiety  Depression  Hallucinations  Mania  Medication stabilization  Suicidal ideation  Withdrawal symptoms   Estimated Length of Stay: 1-2 days    Attendees:  Patient:                         Physician: Dr. Vanetta ShawlHisada, MD  04/06/2016   9:30am  Nursing: Filbert Bertholdoni Waller, Jim, RN   04/06/2016 9:30am  RN Care Manager: Onnie BoerJennifer Clark, CM 04/06/16  Social Workers: Samuella BruinKristin Lachanda Buczek, LCSW  04/06/2016 9:30am  Nurse Pratictioners:    Scribe for Treatment Team: Samuella BruinKristin Zayonna Ayuso, LCSW Clinical Social Worker [facility] [phone]

## 2016-04-07 LAB — GLUCOSE, CAPILLARY
GLUCOSE-CAPILLARY: 107 mg/dL — AB (ref 65–99)
GLUCOSE-CAPILLARY: 160 mg/dL — AB (ref 65–99)
Glucose-Capillary: 85 mg/dL (ref 65–99)

## 2016-04-07 NOTE — Progress Notes (Signed)
DAR NOTE: Pt present with flat affect and depressed mood in the unit. Pt has been in the dayroom interacting with peers and staff. Pt complained of difficulty urinating and the doctor was made aware. Pt stated hs goal for today is "dealing with my pain." Pt denies physical pain, took all his meds as scheduled. As per self inventory, pt had a fair night sleep, good appetite, low energy, and poor concentration. Pt rate depression at 9, hopeless ness at 10, and anxiety at 3 Pt's safety ensured with 15 minute and environmental checks. Pt currently denies SI/HI and A/V hallucinations. Pt verbally agrees to seek staff if SI/HI or A/VH occurs and to consult with staff before acting on these thoughts. Will continue POC.

## 2016-04-07 NOTE — Progress Notes (Addendum)
Patient ID: Samuel Simon, male   DOB: [birth year], 50 y.o.   MRN: [id number] [facility]  04/07/2016 2:52 PM Samuel Simon  MRN:  [id number] Subjective:   He states that he felt restless last night; he had an altercation with other peer in the unit. He finds himself irritable and admits that this is not he usually does. He is hoping to be discharged on Friday, and will see therapist and will go to [place] stay with his sister. He reports difficulty staying with his wife to be divorced.   He reports straining to void. He denies dysuria.   Principal Problem: MDD (major depressive disorder), severe (HCC) Diagnosis:   Patient Active Problem List   Diagnosis Date Noted  . Constipation [K59.00]   . Anxiety state [F41.1]   . MDD (major depressive disorder), severe (HCC) [F32.2] 03/31/2016  . AKI (acute kidney injury) (HCC) [N17.9] 07/22/2015  . Pain in the chest [R07.9] 07/22/2015  . Chest pain [R07.9] 07/22/2015  . Normocytic hypochromic anemia [D50.9]   . Hypertriglyceridemia [E78.1] 03/01/2012  . Chest pain with moderate risk of acute coronary syndrome [R07.9] 02/28/2012  . Diabetes mellitus type 2 in obese (HCC) [E11.9, E66.9] 02/28/2012  . HTN (hypertension) [I10] 02/28/2012  . Obstructive sleep apnea [G47.33] 02/28/2012   Total Time spent with patient: 20 minutes  Past Psychiatric History: see HPI  Past Medical History:  Past Medical History:  Diagnosis Date  . Anxiety   . Back pain, chronic   . Complication of anesthesia    difficult waking   . Diabetes mellitus   . Generalized headaches   . Hypertension    Denies history of htn.   . Obstructive sleep apnea   . Suicide attempt [facility](HCC)     Past Surgical History:  Procedure Laterality Date  . CHOLECYSTECTOMY     Family History:  Family History  Problem Relation Age of Onset  . Heart attack Father   . Hypertension Father   . Heart attack Brother   . Diabetes Mother   . Hypertension Mother     Family Psychiatric  History: see HPI Social History:  History  Alcohol Use No    Comment: 1 glass wine per week/biweekly     History  Drug Use No    Social History   Social History  . Marital status: Married    Spouse name: N/A  . Number of children: N/A  . Years of education: N/A   Social History Main Topics  . Smoking status: Never Smoker  . Smokeless tobacco: Never Used  . Alcohol use No     Comment: 1 glass wine per week/biweekly  . Drug use: No  . Sexual activity: Not Asked   Other Topics Concern  . None   Social History Narrative  . None   Additional Social History:                         Sleep: Good  Appetite:  Good  Current Medications: Current Facility-Administered Medications  Medication Dose Route Frequency Provider Last Rate Last Dose  . acetaminophen (TYLENOL) tablet 650 mg  650 mg Oral Q6H PRN Kerry HoughSpencer E Simon, PA-C   650 mg at 04/05/16 [id number]  . alum & mag hydroxide-simeth (MAALOX/MYLANTA) 200-200-20 MG/5ML suspension 30 mL  30 mL Oral Q4H PRN Kerry HoughSpencer E Simon, PA-C      . ARIPiprazole (ABILIFY) tablet 5 mg  5 mg Oral Daily Neysa Hottereina Hussam Muniz, MD  5 mg at 04/07/16 0802  . aspirin EC tablet 81 mg  81 mg Oral Daily Kerry Hough, PA-C   81 mg at 04/07/16 0802  . buPROPion (WELLBUTRIN XL) 24 hr tablet 150 mg  150 mg Oral Daily Neysa Hotter, MD   150 mg at 04/07/16 0803  . diphenhydrAMINE-zinc acetate (BENADRYL) 2-0.1 % cream   Topical TID PRN Neysa Hotter, MD      . docusate sodium (COLACE) capsule 100 mg  100 mg Oral Daily Shuvon B Rankin, NP   100 mg at 04/07/16 0802  . DULoxetine (CYMBALTA) DR capsule 90 mg  90 mg Oral Daily Neysa Hotter, MD   90 mg at 04/07/16 0802  . glipiZIDE (GLUCOTROL) tablet 5 mg  5 mg Oral QAC breakfast Kerry Hough, PA-C   5 mg at 04/07/16 1914  . hydrOXYzine (ATARAX/VISTARIL) tablet 25 mg  25 mg Oral Q6H PRN Kerry Hough, PA-C   25 mg at 04/05/16 0831  . ibuprofen (ADVIL,MOTRIN) tablet 200 mg  200 mg Oral Q6H  PRN Neysa Hotter, MD   200 mg at 04/06/16 1055  . insulin aspart (novoLOG) injection 0-20 Units  0-20 Units Subcutaneous TID WC Kerry Hough, PA-C   3 Units at 04/06/16 1730  . magnesium hydroxide (MILK OF MAGNESIA) suspension 30 mL  30 mL Oral Daily PRN Kerry Hough, PA-C      . traZODone (DESYREL) tablet 50 mg  50 mg Oral QHS,MR X 1 Kerry Hough, PA-C   50 mg at 04/06/16 2314    Lab Results:  Results for orders placed or performed during the hospital encounter of 03/31/16 (from the past 48 hour(s))  Glucose, capillary     Status: Abnormal   Collection Time: 04/05/16  5:49 PM  Result Value Ref Range   Glucose-Capillary 173 (H) 65 - 99 mg/dL  Glucose, capillary     Status: Abnormal   Collection Time: 04/05/16  9:16 PM  Result Value Ref Range   Glucose-Capillary 160 (H) 65 - 99 mg/dL   Comment 1 Notify RN   Glucose, capillary     Status: Abnormal   Collection Time: 04/06/16  6:05 AM  Result Value Ref Range   Glucose-Capillary 111 (H) 65 - 99 mg/dL  Glucose, capillary     Status: None   Collection Time: 04/06/16 12:09 PM  Result Value Ref Range   Glucose-Capillary 93 65 - 99 mg/dL   Comment 1 Notify RN   Glucose, capillary     Status: Abnormal   Collection Time: 04/06/16  5:07 PM  Result Value Ref Range   Glucose-Capillary 130 (H) 65 - 99 mg/dL   Comment 1 Notify RN   Glucose, capillary     Status: Abnormal   Collection Time: 04/07/16  6:05 AM  Result Value Ref Range   Glucose-Capillary 107 (H) 65 - 99 mg/dL   Comment 1 Notify RN    Comment 2 Document in Chart   Glucose, capillary     Status: None   Collection Time: 04/07/16 12:12 PM  Result Value Ref Range   Glucose-Capillary 85 65 - 99 mg/dL    Blood Alcohol level:  Lab Results  Component Value Date   ETH <5 03/30/2016   ETH <5 12/30/2015    Metabolic Disorder Labs: Lab Results  Component Value Date   HGBA1C 8.1 (H) 04/01/2016   MPG 186 04/01/2016   MPG 194 07/22/2015   No results found for:  PROLACTIN Lab Results  Component Value Date   CHOL 201 (H) 04/01/2016   TRIG 152 (H) 04/01/2016   HDL 46 04/01/2016   CHOLHDL 4.4 04/01/2016   VLDL 30 04/01/2016   LDLCALC 125 (H) 04/01/2016   LDLCALC 62 02/29/2012    Physical Findings: AIMS: Facial and Oral Movements Muscles of Facial Expression: None, normal Lips and Perioral Area: None, normal Jaw: None, normal Tongue: None, normal,Extremity Movements Upper (arms, wrists, hands, fingers): None, normal Lower (legs, knees, ankles, toes): None, normal, Trunk Movements Neck, shoulders, hips: None, normal, Overall Severity Severity of abnormal movements (highest score from questions above): None, normal Incapacitation due to abnormal movements: None, normal Patient's awareness of abnormal movements (rate only patient's report): No Awareness, Dental Status Current problems with teeth and/or dentures?: No Does patient usually wear dentures?: No  CIWA:    COWS:     Musculoskeletal: Strength & Muscle Tone: within normal limits Gait & Station: normal Patient leans: N/A  Psychiatric Specialty Exam: Physical Exam  Constitutional: He is oriented to person, place, and time. He appears well-developed and well-nourished.  Neurological: He is alert and oriented to person, place, and time.    Review of Systems  Gastrointestinal: Negative for constipation.  Genitourinary: Negative for dysuria.       Straining to void  Musculoskeletal: Positive for back pain.  Skin: Positive for rash.  Neurological: Positive for headaches. Negative for dizziness.  Psychiatric/Behavioral: Positive for depression. Negative for hallucinations, substance abuse and suicidal ideas. The patient is nervous/anxious and has insomnia.   All other systems reviewed and are negative.   Blood pressure 109/86, pulse 100, temperature 97.5 F (36.4 C), temperature source Oral, resp. rate 18, height 5\' 9"  (1.753 m), weight 260 lb (117.9 kg).Body mass index is 38.4  kg/m.  General Appearance: Fairly Groomed  Eye Contact:  Fair  Speech:  Clear and Coherent  Volume:  Normal  Mood:  Depressed  Affect:  Depressed and Restricted - improving  Thought Process:  Coherent and Goal Directed  Orientation:  Full (Time, Place, and Person)  Thought Content:  Logical Perceptions: denies AH/VH  Suicidal Thoughts:  No  Homicidal Thoughts:  No  Memory:  intact  Judgement:  Fair  Insight:  Fair  Psychomotor Activity:  Decreased  Concentration:  Concentration: Fair and Attention Span: Fair  Recall:  [employer] of Knowledge:  Fair  Language:  Fair  Akathisia:  No  Handed:  Right  AIMS (if indicated):     Assets:  Communication Skills Desire for Improvement  ADL's:  Intact  Cognition:  WNL  Sleep:  Number of Hours: 5   Assessment Mr. Petrak Russella Dar) is a 50 year old male with depression, chronic pain, hypertension, diabetes, obstructive sleep apnea, temporal arteritis per patient report who presented to the ED for worsening suicidal ideation with plan to cutting his wrist. Psychosocial stressors including worsening pain, marital discordance and recent death of his mother.   # MDD, severe, recurrent, without psychotic features There has been slow but steady improvement in his neurovegetative symptoms. Will continue current medication. Behavioral activation has been discussed.  # urinary symptoms # r/o BHP He states straining to void. No meds has significant anticholinergics; will obtain urine to r/o UTI.  Plans - Continue Cymbalta 90 mg daily - Continue bupropion ER 150 mg daily  - Continue Abilify 5 mg daily - urinalysis - Reviewed Creatinine - 1.64, baseline - Reviewed TSH; 1.369 - Medication management to reduce current symptoms to base line and improve the patient's  overall level of functioning. - Monitor for the adverse effect of the medications and anger outbursts - Continue 15 minutes observation for safety concerns - Encouraged to  participate in milieu therapy and group therapy counseling sessions and also work with coping skills -  Develop treatment plan to decrease risk of relapse upon discharge and to reduce the need for readmission. -  Psycho-social education regarding relapse prevention and self care. - Health care follow up as needed for medical problems. - Restart home medications where appropriate.  Treatment Plan Summary: Daily contact with patient to assess and evaluate symptoms and progress in treatment  Neysa Hotter, MD 04/07/2016, 2:52 PM

## 2016-04-07 NOTE — Progress Notes (Signed)
D    Pt complained he was having to push real hard to urinate but was urinating a good amount     Pt was pleasant on approach and cooperative   He interacts minimally but appropriately with others A    Verbal support given   Medications administered and effectiveness monitored   Encouraged pt to inform his doctor about his urinary problem    Discussed when the last time he got his prostate checked and how an enlarged prostate would effect his ability to urinate   Also some medications can effect it too so speak with the doctor  Q 15 min checks R    Pt safe and was receptive to verbal support and agreed to speak with his doctor regarding his problem

## 2016-04-07 NOTE — Progress Notes (Signed)
Recreation Therapy Notes  Date: 04/07/16 Time: 0930 Location: 300 Hall Dayroom  Group Topic: Stress Management  Goal Area(s) Addresses:  Patient will verbalize importance of using healthy stress management.  Patient will identify positive emotions associated with healthy stress management.   Intervention: Stress Management   Activity :  Progressive Muscle Relaxation.  LRT introduced to technique of progressive muscle relaxation to patients.  LRT read a script to guide the patients through the exercise.  Patients were to follow along as LRT read the script to engage in the activity.  Education:  Stress Management, Discharge Planning.   Education Outcome: Needs additional education  Clinical Observations/Feedback:  Pt did not attend group.    Caroll RancherMarjette Montarius Kitagawa, LRT/CTRS         Caroll RancherLindsay, Willo Yoon A 04/07/2016 3:09 PM

## 2016-04-08 LAB — GLUCOSE, CAPILLARY
GLUCOSE-CAPILLARY: 102 mg/dL — AB (ref 65–99)
GLUCOSE-CAPILLARY: 88 mg/dL (ref 65–99)
Glucose-Capillary: 59 mg/dL — ABNORMAL LOW (ref 65–99)
Glucose-Capillary: 222 mg/dL — ABNORMAL HIGH (ref 65–99)

## 2016-04-08 LAB — URINE MICROSCOPIC-ADD ON

## 2016-04-08 LAB — URINALYSIS, ROUTINE W REFLEX MICROSCOPIC
Bilirubin Urine: NEGATIVE
GLUCOSE, UA: 100 mg/dL — AB
KETONES UR: NEGATIVE mg/dL
LEUKOCYTES UA: NEGATIVE
Nitrite: NEGATIVE
PROTEIN: NEGATIVE mg/dL
Specific Gravity, Urine: 1.019 (ref 1.005–1.030)
pH: 5.5 (ref 5.0–8.0)

## 2016-04-08 NOTE — Plan of Care (Signed)
Problem: Education: Goal: Utilization of techniques to improve thought processes will improve Outcome: Progressing Nurse discussed depression/coping skills with patient.    

## 2016-04-08 NOTE — Progress Notes (Signed)
Hypoglycemic Event  CBG: 59  Treatment: 15 GM carbohydrate snack  Symptoms: None  Follow-up CBG: Time: 1300 CBG Result:  222  Possible Reasons for Event: Unknown  Comments/MD notified:   NP informed of CBG 59.  Nurse instructed to give patient lunch food and drink.   Patient's follow up CBG 222 at 1300.  NP informed of  CBG.  Nurse was instructed by NP not to give insulin.  Do CBG at 1700 and follow up with NP.   Patient stated he is feeling good, no problems, NP informed of all events.     Samuel Simon, Samuel Simon

## 2016-04-08 NOTE — Progress Notes (Signed)
D     Pt was pleasant on approach and cooperative   He interacts minimally but appropriately with others A    Verbal support given   Medications administered and effectiveness monitor   Q 15 min checks R    Pt safe and was receptive to verbal support

## 2016-04-08 NOTE — [facility] Group Notes (Signed)
[facility] Group Notes:  (Nursing/MHT/Case Management/Adjunct)  Date:  04/08/2016  Time:  9:15 AM  Type of Therapy:  Psychoeducational Skills  Participation Level:  Active  Participation Quality:  Appropriate and Attentive  Affect:  Blunted  Cognitive:  Alert  Insight:  Appropriate  Engagement in Group:  Engaged  Modes of Intervention:  Discussion and Education  Summary of Progress/Problems: Patient attended group on 400 hall and is attentive and engaged.  Samuel Simon E 04/08/2016, 9:15 AM

## 2016-04-08 NOTE — [facility] Group Notes (Signed)
Late Entry from 8/29:  [facility] LCSW Group Therapy     1:15 PM   Type of Therapy:  Group Therapy  Participation Level:  Minimal  Participation Quality: Limited  Affect:  Lethargic  Cognitive: Alert and Oriented  Insight: Developing/Improving and Engaged  Engagement in Therapy: Developing/Improving and Engaged  Modes of Intervention: Clarification, Confrontation, Discussion, Education, Exploration, Limit-setting, Orientation, Problem-solving, Rapport Building, Dance movement psychotherapisteality Testing, Socialization and Support  Summary of Progress/Problems: The topic for group today was emotional regulation. This group focused on both positive and negative emotion identification and allowed group members to process ways to identify feelings, regulate negative emotions, and find healthy ways to manage internal/external emotions. Group members were asked to reflect on a time when their reaction to an emotion led to a negative outcome and explored how alternative responses using emotion regulation would have benefited them. Group members were also asked to discuss a time when emotion regulation was utilized when a negative emotion was experienced.  Samuel BruinKristin Marquarius Simon, MSW, LCSW Clinical Social Worker [facility] [phone]

## 2016-04-08 NOTE — Progress Notes (Signed)
Patient ID: Samuel Simon, male   DOB: 1966-05-16, 50 y.o.   MRN: [id number] [facility] MD Progress Note  04/08/2016 3:07 PM Samuel Simon  MRN:  [id number] Subjective:   Patient seen, chart reviewed and case discussed with nursing staff.   He states that he feels very nervous about discharge tomorrow. He is planning to go back home and will go to [place]. He is planning to eventually move to [place] to live close with his sister. He continues feel irritable about the peer in the unit, who reminds him of his wife. He denies SI.   Principal Problem: MDD (major depressive disorder), severe (HCC) Diagnosis:   Patient Active Problem List   Diagnosis Date Noted  . Constipation [K59.00]   . Anxiety state [F41.1]   . MDD (major depressive disorder), severe (HCC) [F32.2] 03/31/2016  . AKI (acute kidney injury) (HCC) [N17.9] 07/22/2015  . Pain in the chest [R07.9] 07/22/2015  . Chest pain [R07.9] 07/22/2015  . Normocytic hypochromic anemia [D50.9]   . Hypertriglyceridemia [E78.1] 03/01/2012  . Chest pain with moderate risk of acute coronary syndrome [R07.9] 02/28/2012  . Diabetes mellitus type 2 in obese (HCC) [E11.9, E66.9] 02/28/2012  . HTN (hypertension) [I10] 02/28/2012  . Obstructive sleep apnea [G47.33] 02/28/2012   Total Time spent with patient: 20 minutes  Past Psychiatric History: see HPI  Past Medical History:  Past Medical History:  Diagnosis Date  . Anxiety   . Back pain, chronic   . Complication of anesthesia    difficult waking   . Diabetes mellitus   . Generalized headaches   . Hypertension    Denies history of htn.   . Obstructive sleep apnea   . Suicide attempt [facility])     Past Surgical History:  Procedure Laterality Date  . CHOLECYSTECTOMY     Family History:  Family History  Problem Relation Age of Onset  . Heart attack Father   . Hypertension Father   . Heart attack Brother   . Diabetes Mother   . Hypertension Mother    Family Psychiatric   History: see HPI Social History:  History  Alcohol Use No    Comment: 1 glass wine per week/biweekly     History  Drug Use No    Social History   Social History  . Marital status: Married    Spouse name: N/A  . Number of children: N/A  . Years of education: N/A   Social History Main Topics  . Smoking status: Never Smoker  . Smokeless tobacco: Never Used  . Alcohol use No     Comment: 1 glass wine per week/biweekly  . Drug use: No  . Sexual activity: Not Asked   Other Topics Concern  . None   Social History Narrative  . None   Additional Social History:                         Sleep: Good  Appetite:  Good  Current Medications: Current Facility-Administered Medications  Medication Dose Route Frequency Provider Last Rate Last Dose  . acetaminophen (TYLENOL) tablet 650 mg  650 mg Oral Q6H PRN Kerry Hough, PA-C   650 mg at 04/05/16 6962  . alum & mag hydroxide-simeth (MAALOX/MYLANTA) 200-200-20 MG/5ML suspension 30 mL  30 mL Oral Q4H PRN Kerry Hough, PA-C      . ARIPiprazole (ABILIFY) tablet 5 mg  5 mg Oral Daily Neysa Hotter, MD   5 mg at 04/08/16  [id number]  . aspirin EC tablet 81 mg  81 mg Oral Daily Kerry HoughSpencer E Simon, PA-C   81 mg at 04/08/16 [id number]  . buPROPion (WELLBUTRIN XL) 24 hr tablet 150 mg  150 mg Oral Daily Neysa Hottereina Dalaina Tates, MD   150 mg at 04/08/16 0817  . diphenhydrAMINE-zinc acetate (BENADRYL) 2-0.1 % cream   Topical TID PRN Neysa Hottereina Dawanda Mapel, MD      . docusate sodium (COLACE) capsule 100 mg  100 mg Oral Daily Shuvon B Rankin, NP   100 mg at 04/08/16 0816  . DULoxetine (CYMBALTA) DR capsule 90 mg  90 mg Oral Daily Neysa Hottereina Wilgus Deyton, MD   90 mg at 04/08/16 0816  . glipiZIDE (GLUCOTROL) tablet 5 mg  5 mg Oral QAC breakfast Kerry HoughSpencer E Simon, PA-C   5 mg at 04/08/16 0815  . hydrOXYzine (ATARAX/VISTARIL) tablet 25 mg  25 mg Oral Q6H PRN Kerry HoughSpencer E Simon, PA-C   25 mg at 04/07/16 2315  . ibuprofen (ADVIL,MOTRIN) tablet 200 mg  200 mg Oral Q6H PRN Neysa Hottereina Lakrista Scaduto, MD   200  mg at 04/06/16 1055  . insulin aspart (novoLOG) injection 0-20 Units  0-20 Units Subcutaneous TID WC Kerry HoughSpencer E Simon, PA-C   4 Units at 04/07/16 1720  . magnesium hydroxide (MILK OF MAGNESIA) suspension 30 mL  30 mL Oral Daily PRN Kerry HoughSpencer E Simon, PA-C      . traZODone (DESYREL) tablet 50 mg  50 mg Oral QHS,MR X 1 Kerry HoughSpencer E Simon, PA-C   50 mg at 04/07/16 2315    Lab Results:  Results for orders placed or performed during the hospital encounter of 03/31/16 (from the past 48 hour(s))  Glucose, capillary     Status: Abnormal   Collection Time: 04/06/16  5:07 PM  Result Value Ref Range   Glucose-Capillary 130 (H) 65 - 99 mg/dL   Comment 1 Notify RN   Glucose, capillary     Status: Abnormal   Collection Time: 04/07/16  6:05 AM  Result Value Ref Range   Glucose-Capillary 107 (H) 65 - 99 mg/dL   Comment 1 Notify RN    Comment 2 Document in Chart   Glucose, capillary     Status: None   Collection Time: 04/07/16 12:12 PM  Result Value Ref Range   Glucose-Capillary 85 65 - 99 mg/dL  Glucose, capillary     Status: Abnormal   Collection Time: 04/07/16  4:34 PM  Result Value Ref Range   Glucose-Capillary 160 (H) 65 - 99 mg/dL   Comment 1 Notify RN    Comment 2 Document in Chart   Urinalysis, Routine w reflex microscopic (not at [facility])     Status: Abnormal   Collection Time: 04/07/16  5:23 PM  Result Value Ref Range   Color, Urine YELLOW YELLOW   APPearance CLOUDY (A) CLEAR   Specific Gravity, Urine 1.019 1.005 - 1.030   pH 5.5 5.0 - 8.0   Glucose, UA 100 (A) NEGATIVE mg/dL   Hgb urine dipstick LARGE (A) NEGATIVE   Bilirubin Urine NEGATIVE NEGATIVE   Ketones, ur NEGATIVE NEGATIVE mg/dL   Protein, ur NEGATIVE NEGATIVE mg/dL   Nitrite NEGATIVE NEGATIVE   Leukocytes, UA NEGATIVE NEGATIVE    Comment: Performed at [facility]  Urine microscopic-add on     Status: Abnormal   Collection Time: 04/07/16  5:23 PM  Result Value Ref Range   Squamous Epithelial / LPF 0-5 (A)  NONE SEEN   WBC, UA 0-5 0 - 5 WBC/hpf  RBC / HPF 6-30 0 - 5 RBC/hpf   Bacteria, UA RARE (A) NONE SEEN    Comment: Performed at [facility]  Glucose, capillary     Status: Abnormal   Collection Time: 04/08/16  6:06 AM  Result Value Ref Range   Glucose-Capillary 102 (H) 65 - 99 mg/dL  Glucose, capillary     Status: Abnormal   Collection Time: 04/08/16 11:50 AM  Result Value Ref Range   Glucose-Capillary 59 (L) 65 - 99 mg/dL   Comment 1 Notify RN    Comment 2 Document in Chart   Glucose, capillary     Status: Abnormal   Collection Time: 04/08/16  1:06 PM  Result Value Ref Range   Glucose-Capillary 222 (H) 65 - 99 mg/dL   Comment 1 Notify RN    Comment 2 Document in Chart     Blood Alcohol level:  Lab Results  Component Value Date   ETH <5 03/30/2016   ETH <5 12/30/2015    Metabolic Disorder Labs: Lab Results  Component Value Date   HGBA1C 8.1 (H) 04/01/2016   MPG 186 04/01/2016   MPG 194 07/22/2015   No results found for: PROLACTIN Lab Results  Component Value Date   CHOL 201 (H) 04/01/2016   TRIG 152 (H) 04/01/2016   HDL 46 04/01/2016   CHOLHDL 4.4 04/01/2016   VLDL 30 04/01/2016   LDLCALC 125 (H) 04/01/2016   LDLCALC 62 02/29/2012    Physical Findings: AIMS: Facial and Oral Movements Muscles of Facial Expression: None, normal Lips and Perioral Area: None, normal Jaw: None, normal Tongue: None, normal,Extremity Movements Upper (arms, wrists, hands, fingers): None, normal Lower (legs, knees, ankles, toes): None, normal, Trunk Movements Neck, shoulders, hips: None, normal, Overall Severity Severity of abnormal movements (highest score from questions above): None, normal Incapacitation due to abnormal movements: None, normal Patient's awareness of abnormal movements (rate only patient's report): No Awareness, Dental Status Current problems with teeth and/or dentures?: No Does patient usually wear dentures?: No  CIWA:  CIWA-Ar Total:  1 COWS:  COWS Total Score: 2  Musculoskeletal: Strength & Muscle Tone: within normal limits Gait & Station: normal Patient leans: N/A  Psychiatric Specialty Exam: Physical Exam  Constitutional: He is oriented to person, place, and time. He appears well-developed and well-nourished.  Neurological: He is alert and oriented to person, place, and time.    Review of Systems  Gastrointestinal: Negative for constipation.  Genitourinary: Negative for dysuria.       Straining to void  Musculoskeletal: Positive for back pain.  Skin: Positive for rash.  Neurological: Positive for headaches. Negative for dizziness.  Psychiatric/Behavioral: Positive for depression. Negative for hallucinations, substance abuse and suicidal ideas. The patient is nervous/anxious and has insomnia.   All other systems reviewed and are negative.   Blood pressure 123/71, pulse 86, temperature 97.7 F (36.5 C), temperature source Oral, resp. rate 16, height 5\' 9"  (1.753 m), weight 260 lb (117.9 kg).Body mass index is 38.4 kg/m.  General Appearance: Fairly Groomed  Eye Contact:  Fair  Speech:  Clear and Coherent  Volume:  Normal  Mood:  Depressed  Affect:  Depressed and Restricted - improving  Thought Process:  Coherent and Goal Directed  Orientation:  Full (Time, Place, and Person)  Thought Content:  Logical Perceptions: denies AH/VH  Suicidal Thoughts:  No  Homicidal Thoughts:  No  Memory:  intact  Judgement:  Fair  Insight:  Fair  Psychomotor Activity:  Normal  Concentration:  Concentration: Fair and Attention Span: Fair  Recall:  [employer] of Knowledge:  Fair  Language:  Fair  Akathisia:  No  Handed:  Right  AIMS (if indicated):     Assets:  Communication Skills Desire for Improvement  ADL's:  Intact  Cognition:  WNL  Sleep:  Number of Hours: 5   Assessment Mr. Samuel Simon) is a 50 year old male with depression, chronic pain, hypertension, diabetes, obstructive sleep apnea, temporal  arteritis per patient report who presented to the ED for worsening suicidal ideation with plan to cutting his wrist. Psychosocial stressors including worsening pain, marital discordance and recent death of his mother.   # MDD, severe, recurrent, without psychotic features There has been an improvement in his neurovegetative symptoms. Will continue current medication. Behavioral activation has been discussed.  # urinary symptoms # r/o [facility]HP U/A with no findings consistent with UTI, but had hematuria. Will advise patient to follow up with his PCP.   Plans - Continue Cymbalta 90 mg daily - Continue bupropion ER 150 mg daily  - Continue Abilify 5 mg daily - Reviewed Creatinine - 1.64, baseline - Reviewed TSH; 1.369 - Medication management to reduce current symptoms to base line and improve the patient's overall level of functioning. - Monitor for the adverse effect of the medications and anger outbursts - Continue 15 minutes observation for safety concerns - Encouraged to participate in milieu therapy and group therapy counseling sessions and also work with coping skills -  Develop treatment plan to decrease risk of relapse upon discharge and to reduce the need for readmission. -  Psycho-social education regarding relapse prevention and self care. - Health care follow up as needed for medical problems. - Restart home medications where appropriate.  Treatment Plan Summary: Daily contact with patient to assess and evaluate symptoms and progress in treatment  Neysa Hotter, MD 04/08/2016, 3:07 PM

## 2016-04-08 NOTE — Progress Notes (Signed)
D:  Patent's self inventory sheet, patient has fair sleep, sleep medication is helpful.   Good appetite, low energy level, good concentration.  Rated depression 7, hopleless 9, anxiety 1`0.  Denied withdrawals.  Denied SI.  Physical problems, pain, headaches, blurred vision.  Physical pain, worst pain # 9, groin, back, head.  No pain medication.  Goal is getting ready to leave.  Plans to continue working on anxiety.  "Thank all the staff members for all their help."  Does have discharge plans.  "My ability to adjust back to being out into the society." A:  Medications administered per MD orders.  Emotional support and encouragement given patient. R:  Denied SI and HI, contracts for safety.  Denied A/V hallucinations.  Safety maintained with 15 minute checks.

## 2016-04-08 NOTE — [facility] Group Notes (Signed)

## 2016-04-08 NOTE — [facility] Group Notes (Signed)
Late Entry from 8/29:  [facility] LCSW Group Therapy     1:15 PM   Type of Therapy:  Group Therapy  Participation Level:  Minimal  Participation Quality: Limited  Affect:  Lethargic  Cognitive:  Alert and Oriented  Insight:  Developing/Improving and Engaged  Engagement in Therapy:  Developing/Improving and Engaged  Modes of Intervention:  Clarification, Confrontation, Discussion, Education, Exploration, Limit-setting, Orientation, Problem-solving, Rapport Building, Dance movement psychotherapisteality Testing, Socialization and Support  Summary of Progress/Problems: The topic for group therapy was feelings about diagnosis.  Pt actively participated in group discussion on their past and current diagnosis and how they feel towards this.  Pt also identified how society and family members judge them, based on their diagnosis as well as stereotypes and stigmas.    Samuel BruinKristin Rickard Simon, MSW, LCSW Clinical Social Worker [facility] [phone]

## 2016-04-08 NOTE — [facility] Group Notes (Signed)
[facility] LCSW Group Therapy 04/08/2016 1:15 PM Type of Therapy: Group Therapy Participation Level: Active  Participation Quality: Attentive  Affect: Lethargic  Cognitive: Alert and Oriented  Insight: Developing/Improving and Engaged  Engagement in Therapy: Developing/Improving and Engaged  Modes of Intervention: Activity, Clarification, Confrontation, Discussion, Education, Exploration, Limit-setting, Orientation, Problem-solving, Rapport Building, Dance movement psychotherapisteality Testing, Socialization and Support  Summary of Progress/Problems: Patient was attentive and engaged with speaker from [employer]. Patient was attentive to speaker while they shared their story of dealing with mental health and overcoming it. Patient expressed interest in their programs and services and received information on their agency. Patient processed ways they can relate to the speaker.   Samuella BruinKristin Amarrion Pastorino, LCSW Clinical Social Worker [facility] [phone]

## 2016-04-09 LAB — URINE CULTURE: CULTURE: NO GROWTH

## 2016-04-09 LAB — GLUCOSE, CAPILLARY: Glucose-Capillary: 123 mg/dL — ABNORMAL HIGH (ref 65–99)

## 2016-04-09 MED ORDER — IBUPROFEN 200 MG PO TABS: 200.0000 mg | ORAL_TABLET | Freq: Four times a day (QID) | ORAL | 0 refills | Status: AC | PRN

## 2016-04-09 MED ORDER — DOCUSATE SODIUM 100 MG PO CAPS: 100.0000 mg | ORAL_CAPSULE | Freq: Every day | ORAL | 0 refills | Status: AC

## 2016-04-09 MED ORDER — DULOXETINE HCL 30 MG PO CPEP: 90.0000 mg | ORAL_CAPSULE | Freq: Every day | ORAL | 0 refills | Status: AC

## 2016-04-09 MED ORDER — BUPROPION HCL ER (XL) 150 MG PO TB24: 150.0000 mg | ORAL_TABLET | Freq: Every day | ORAL | 0 refills | Status: AC

## 2016-04-09 MED ORDER — HYDROXYZINE HCL 25 MG PO TABS: 25.0000 mg | ORAL_TABLET | Freq: Four times a day (QID) | ORAL | 0 refills | Status: AC | PRN

## 2016-04-09 MED ORDER — GLIPIZIDE 5 MG PO TABS: 5.0000 mg | ORAL_TABLET | Freq: Every day | ORAL | Status: AC

## 2016-04-09 MED ORDER — ASPIRIN EC 81 MG PO TBEC: 81.0000 mg | DELAYED_RELEASE_TABLET | Freq: Every day | ORAL | Status: AC

## 2016-04-09 MED ORDER — ARIPIPRAZOLE 5 MG PO TABS: 5.0000 mg | ORAL_TABLET | Freq: Every day | ORAL | 0 refills | Status: AC

## 2016-04-09 MED ORDER — TRAZODONE HCL 50 MG PO TABS: 50.0000 mg | ORAL_TABLET | Freq: Every evening | ORAL | 0 refills | Status: AC | PRN

## 2016-04-09 NOTE — Progress Notes (Signed)
D: Pt denies SI/HI/AV hallucinations. Pt is pleasant and cooperative. Pt goal for today is to "plan for discharge". A: Pt was offered support and encouragement. Pt was given scheduled medications. Pt was encourage to attend groups. Q 15 minute checks were done for safety.  R:Pt attends groups and interacts well with peers and staff. Pt is taking medication. Pt has no complaints.Pt receptive to treatment and safety maintained on unit.

## 2016-04-09 NOTE — [facility] Suicide Risk Assessment (Signed)
[facility] Discharge Suicide Risk Assessment   Principal Problem: MDD (major depressive disorder), severe [facility]) Discharge Diagnoses:  Patient Active Problem List   Diagnosis Date Noted  . Constipation [K59.00]   . Anxiety state [F41.1]   . MDD (major depressive disorder), severe (HCC) [F32.2] 03/31/2016  . AKI (acute kidney injury) (HCC) [N17.9] 07/22/2015  . Pain in the chest [R07.9] 07/22/2015  . Chest pain [R07.9] 07/22/2015  . Normocytic hypochromic anemia [D50.9]   . Hypertriglyceridemia [E78.1] 03/01/2012  . Chest pain with moderate risk of acute coronary syndrome [R07.9] 02/28/2012  . Diabetes mellitus type 2 in obese (HCC) [E11.9, E66.9] 02/28/2012  . HTN (hypertension) [I10] 02/28/2012  . Obstructive sleep apnea [G47.33] 02/28/2012    Total Time spent with patient: 20 minutes  Musculoskeletal: Strength & Muscle Tone: within normal limits Gait & Station: normal Patient leans: N/A  Psychiatric Specialty Exam: Review of Systems  Psychiatric/Behavioral: Positive for depression. Negative for hallucinations, substance abuse and suicidal ideas. The patient is nervous/anxious. The patient does not have insomnia.   All other systems reviewed and are negative.   Blood pressure 120/76, pulse 79, temperature 98 F (36.7 C), temperature source Oral, resp. rate 18, height 5\' 9"  (1.753 m), weight 260 lb (117.9 kg).Body mass index is 38.4 kg/m.  General Appearance: Casual  Eye Contact::  Fair  Speech:  Clear and Coherent409  Volume:  Normal  Mood:  Anxious  Affect:  Depressed  Thought Process:  Coherent and Goal Directed  Orientation:  Full (Time, Place, and Person)  Thought Content:  Logical  Suicidal Thoughts:  No  Homicidal Thoughts:  No  Memory:  intact to recent and remote recall  Judgement:  Fair  Insight:  Fair  Psychomotor Activity:  Normal  Concentration:  Good  Recall:  Good  Fund of Knowledge:Good  Language: Good  Akathisia:  No  Handed:  Right  AIMS (if  indicated):     Assets:  Communication Skills Desire for Improvement  Sleep:  Number of Hours: 6.75  Cognition: WNL  ADL's:  Intact   Mental Status Per Nursing Assessment::   On Admission:   depressed  Demographic Factors:  Male and Unemployed  Loss Factors: Loss of significant relationship and Decline in physical health  Historical Factors: Prior suicide attempts, Family history of suicide and Family history of mental illness or substance abuse  OD sleeping pills In March Maternal aunt- suicide attempt, she killed her baby Maternal aunt- depression Mother- bipolar disorder Sister- depression Brother- bipolar disorder  Risk Reduction Factors:   Sense of responsibility to family, Positive social support and Positive therapeutic relationship  Continued Clinical Symptoms:  Severe Anxiety and/or Agitation  Cognitive Features That Contribute To Risk:  None    Suicide Risk:  Mild:  Suicidal ideation of limited frequency, intensity, duration, and specificity.  There are no identifiable plans, no associated intent, mild dysphoria and related symptoms, good self-control (both objective and subjective assessment), few other risk factors, and identifiable protective factors, including available and accessible social support.  Follow-up Information    [facility] Follow up on 04/09/2016.   Why:  Hospital follow up appt with Nyra Jabs on Friday Sept. 1st at 12:30pm. Call office if you need to reschedule. Please inquire about referral for peer support services if interested. Contact information: [address]  [place] [phone]         Patient will be discharged to home. He will see his therapist before going back to home. He is future  oriented and denies SI.  Plan Of Care/Follow-up recommendations:  Activity:  no restriction Diet:  regular Tests:  none Other:  n/a  Neysa Hottereina Florencia Zaccaro, MD 04/09/2016, 9:57 AM

## 2016-04-09 NOTE — Progress Notes (Signed)
  [facility] Discharge Plan :  Will you be returning to the same living situation after discharge:  Yes,  patient plans to return home At discharge, do you have transportation home?: Yes,  daughter Do you have the ability to pay for your medications: Yes,  patient will be provided with prescriptions at discharge  Release of information consent forms completed and in the chart;  Patient's signature needed at discharge.  Patient to Follow up at: Follow-up Information    [facility] Follow up on 04/09/2016.   Why:  Hospital follow up appt with Nyra JabsFrancis Gill on Friday Sept. 1st at 12:30pm. Call office if you need to reschedule. Please inquire about referral for peer support services if interested. Contact information: [address]  [place] [phone]          Next level of care provider has [facility] to [facility] Link:no  Safety Planning and Suicide Prevention discussed: Yes,  with patient  Have you used any form of tobacco in the last 30 days? (Cigarettes, Smokeless Tobacco, Cigars, and/or Pipes): No  Has patient been referred to the Quitline?: N/A patient is not a smoker  Patient has been referred for addiction treatment: Yes  Tylique Aull L Amiah Frohlich 04/09/2016, 11:27 AM

## 2016-04-09 NOTE — [facility] Suicide Risk Assessment (Signed)
[facility] INPATIENT:  Family/Significant Other Suicide Prevention Education  Suicide Prevention Education:  Patient Refusal for Family/Significant Other Suicide Prevention Education: The patient Baruch Heidler has refused to provide written consent for family/significant other to be provided Family/Significant Other Suicide Prevention Education during admission and/or prior to discharge.  Physician notified. SPE reviewed with patient and brochure provided. Patient encouraged to return to hospital if having suicidal thoughts, patient verbalized his/her understanding and has no further questions at this time.   Odaly Peri L Khylah Kendra 04/09/2016, 11:26 AM

## 2016-04-09 NOTE — Progress Notes (Signed)
Recreation Therapy Notes  Date: 04/09/16 Time: 0930 Location: [address] Dayroom  Group Topic: Stress Management  Goal Area(s) Addresses:  Patient will verbalize importance of using healthy stress management.  Patient will identify positive emotions associated with healthy stress management.   Behavioral Response: Engaged  Intervention: Stress Management  Activity :  Guided Training and development officermagery Script.  LRT introduced the stress management technique guided imagery.  LRT read script that guided patients in how to perform the technique.  Patients were to follow along as LRT read the script to engage in the technique.  Education: Stress Management, Discharge Planning.   Education Outcome: Acknowledges edcuation/In group clarification offered/Needs additional education  Clinical Observations/Feedback: Pt attended group.   Caroll RancherMarjette Okey Zelek, LRT/CTRS         Caroll RancherLindsay, Dalayza Zambrana A 04/09/2016 1:09 PM

## 2016-04-09 NOTE — Discharge Summary (Signed)
Physician Discharge Summary Note  Patient:  Samuel Simon is an 50 y.o., male MRN:  [id number] DOB:  [birth year] Patient phone:  [phone] (home)  Patient address:   [address] [place] [address],  Total Time spent with patient: Greater than 30 minutes  Date of Admission:  03/31/2016  Date of Discharge: 04-09-16  Reason for Admission: Worsening symptoms of depression with suicidal ideations,  Principal Problem: MDD (major depressive disorder), severe [facility])  Discharge Diagnoses: Patient Active Problem List   Diagnosis Date Noted  . Constipation [K59.00]   . Anxiety state [F41.1]   . MDD (major depressive disorder), severe (HCC) [F32.2] 03/31/2016  . AKI (acute kidney injury) (HCC) [N17.9] 07/22/2015  . Pain in the chest [R07.9] 07/22/2015  . Chest pain [R07.9] 07/22/2015  . Normocytic hypochromic anemia [D50.9]   . Hypertriglyceridemia [E78.1] 03/01/2012  . Chest pain with moderate risk of acute coronary syndrome [R07.9] 02/28/2012  . Diabetes mellitus type 2 in obese (HCC) [E11.9, E66.9] 02/28/2012  . HTN (hypertension) [I10] 02/28/2012  . Obstructive sleep apnea [G47.33] 02/28/2012   Past Psychiatric History: Major depressive disorder.  Past Medical History:  Past Medical History:  Diagnosis Date  . Anxiety   . Back pain, chronic   . Complication of anesthesia    difficult waking   . Diabetes mellitus   . Generalized headaches   . Hypertension    Denies history of htn.   . Obstructive sleep apnea   . Suicide attempt [facility])     Past Surgical History:  Procedure Laterality Date  . CHOLECYSTECTOMY     Family History:  Family History  Problem Relation Age of Onset  . Heart attack Father   . Hypertension Father   . Heart attack Brother   . Diabetes Mother   . Hypertension Mother    Family Psychiatric  History: See H&P  Social History:  History  Alcohol Use No    Comment: 1 glass wine per week/biweekly     History  Drug Use No     Social History   Social History  . Marital status: Married    Spouse name: N/A  . Number of children: N/A  . Years of education: N/A   Social History Main Topics  . Smoking status: Never Smoker  . Smokeless tobacco: Never Used  . Alcohol use No     Comment: 1 glass wine per week/biweekly  . Drug use: No  . Sexual activity: Not Asked   Other Topics Concern  . None   Social History Narrative  . None   Hospital Course: Mr. Samuel Simon) is a 50 year old male with depression, chronic pain, hypertension, diabetes, obstructive sleep apnea, temporal arteritis per patient report who presented to the ED for worsening suicidal ideation.  He states that he has been significantly depressed due to his worsening back pain and headache for several months. 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 He was told of divorce by his wife of 24 years in Dec 2016, although he still lives with her. He had overdosed on Tylenol Pm and was brought to ED in 10/2015. He continues to feel depressed, hopeless and had thought of slitting his wrist about a week ago. He did not act on it, as he saw his daughter who was crying for him. Although he finds his daughter to be helpful, he started  to think that "none of that matter" and called crisis line.  'Samuel Simon' was admitted to the [facility] adult unit with complaints of worsening symptoms of depression triggering suicidal ideations. He cited chronic medical conditions that include back pain & being diagnosed with temporal arthritis as the trigger. He has hx of suicide attempts by overdose. Samuel Simon was in need of mood stabilization treatments. During the course of his hospitalization, he was medicated & discharged on, Duloxetine DR 90 mg for depression, Wellbutrin XL 150 mg for depression, Abilify 5 mg for mood control  & Trazodone 50 mg insomnia. He was enrolled &  participated in the group counseling sessions being offered & held on this unit. He was counseled & learned coping skills that should help him cope better & maintain mood stability after discharge. Samuel Simon was resumed on all his pertinent home medications for the other previously existing medical issues that he presented. He tolerated his treatment regimen without any adverse effects reported.   While his treatment was on going, Samuel Simon's improvement was monitored by observation & his daily reports of symptom reduction noted. His emotional & mental status were monitored by daily self-inventory reports completed by him & the clinical staff. He was evaluated daily by the treatment team for mood stability & the need for continued recovery after discharge. His motivation was an integral factor in his recovery & mood stability. he was offered further treatment options upon discharge & will follow up with the outpatient psychiatric services as listed below.     Upon discharge, Samuel Simon was both mentally & medically stable for discharge. He is currently denying suicidal, homicidal ideation, auditory, visual/tactile hallucinations, delusional thoughts & or paranoia. He was encouraged to continue taking his medications with a goal of continued improvement in mental health. Samuel Simon left [facility] with all personal belongings in no apparent distress. Transportation per city daughter.       Physical Findings: AIMS: Facial and Oral Movements Muscles of Facial Expression: None, normal Lips and Perioral Area: None, normal Jaw: None, normal Tongue: None, normal,Extremity Movements Upper (arms, wrists, hands, fingers): None, normal Lower (legs, knees, ankles, toes): None, normal, Trunk Movements Neck, shoulders, hips: None, normal, Overall Severity Severity of abnormal movements (highest score from questions above): None, normal Incapacitation due to abnormal movements: None, normal Patient's awareness of abnormal  movements (rate only patient's report): No Awareness, Dental Status Current problems with teeth and/or dentures?: No Does patient usually wear dentures?: No  CIWA:  CIWA-Ar Total: 1 COWS:  COWS Total Score: 2  Musculoskeletal: Strength & Muscle Tone: within normal limits Gait & Station: normal Patient leans: N/A  Psychiatric Specialty Exam: Physical Exam  Constitutional: He appears well-developed.  HENT:  Head: Normocephalic.  Eyes: Pupils are equal, round, and reactive to light.  Neck: Normal range of motion.  Cardiovascular: Normal rate.   Respiratory: Effort normal.  Hx. Obstructive sleep apnea  GI: Soft.  Genitourinary:  Genitourinary Comments: Denies any issues in this area.  Musculoskeletal: Normal range of motion.  Neurological: He is alert.  Skin: Skin is warm.    Review of Systems  Constitutional: Negative.   HENT: Negative.   Eyes: Negative.   Respiratory: Negative.   Cardiovascular: Negative.   Gastrointestinal: Negative.   Genitourinary: Negative.   Musculoskeletal: Negative.   Skin: Negative.   Neurological: Negative.   Endo/Heme/Allergies: Negative.   Psychiatric/Behavioral: Positive for depression (Stable). Negative for hallucinations, memory loss, substance abuse and suicidal ideas. The patient has insomnia (Stable). The patient is not nervous/anxious.  Blood pressure 120/76, pulse 79, temperature 98 F (36.7 C), temperature source Oral, resp. rate 18, height 5\' 9"  (1.753 m), weight 117.9 kg (260 lb).Body mass index is 38.4 kg/m.  See Md's SRA   Have you used any form of tobacco in the last 30 days? (Cigarettes, Smokeless Tobacco, Cigars, and/or Pipes): No  Has this patient used any form of tobacco in the last 30 days? (Cigarettes, Smokeless Tobacco, Cigars, and/or Pipes) Yes, No  Blood Alcohol level:  Lab Results  Component Value Date   ETH <5 03/30/2016   ETH <5 12/30/2015   Metabolic Disorder Labs:  Lab Results  Component Value Date    HGBA1C 8.1 (H) 04/01/2016   MPG 186 04/01/2016   MPG 194 07/22/2015   No results found for: PROLACTIN Lab Results  Component Value Date   CHOL 201 (H) 04/01/2016   TRIG 152 (H) 04/01/2016   HDL 46 04/01/2016   CHOLHDL 4.4 04/01/2016   VLDL 30 04/01/2016   LDLCALC 125 (H) 04/01/2016   LDLCALC 62 02/29/2012   See Psychiatric Specialty Exam and Suicide Risk Assessment completed by Attending Physician prior to discharge.  Discharge destination:  Home  Is patient on multiple antipsychotic therapies at discharge:  No   Has Patient had three or more failed trials of antipsychotic monotherapy by history:  No  Recommended Plan for Multiple Antipsychotic Therapies: NA    Medication List    STOP taking these medications   cholecalciferol 1000 units tablet Commonly known as:  VITAMIN D   lisinopril 20 MG tablet Commonly known as:  PRINIVIL,ZESTRIL   naproxen 500 MG tablet Commonly known as:  NAPROSYN   topiramate 25 MG capsule Commonly known as:  TOPAMAX     TAKE these medications     Indication  ARIPiprazole 5 MG tablet Commonly known as:  ABILIFY Take 1 tablet (5 mg total) by mouth daily. For mood control  Indication:  Mood control   aspirin EC 81 MG tablet Take 1 tablet (81 mg total) by mouth daily. For heart health What changed:  additional instructions  Indication:  Heart health   buPROPion 150 MG 24 hr tablet Commonly known as:  WELLBUTRIN XL Take 1 tablet (150 mg total) by mouth daily. For depression  Indication:  Major Depressive Disorder   docusate sodium 100 MG capsule Commonly known as:  COLACE Take 1 capsule (100 mg total) by mouth daily. For constipation  Indication:  Constipation   DULoxetine 30 MG capsule Commonly known as:  CYMBALTA Take 3 capsules (90 mg total) by mouth daily. For depression What changed:  medication strength  how much to take  additional instructions  Indication:  Major Depressive Disorder   glipiZIDE 5 MG  tablet Commonly known as:  GLUCOTROL Take 1 tablet (5 mg total) by mouth daily before breakfast. For diabetes management What changed:  additional instructions  Indication:  Type 2 Diabetes   hydrOXYzine 25 MG tablet Commonly known as:  ATARAX/VISTARIL Take 1 tablet (25 mg total) by mouth every 6 (six) hours as needed for anxiety.  Indication:  Anxiety   ibuprofen 200 MG tablet Commonly known as:  ADVIL,MOTRIN Take 1 tablet (200 mg total) by mouth every 6 (six) hours as needed for headache.  Indication:  Migraine Headache, Headache   traZODone 50 MG tablet Commonly known as:  DESYREL Take 1 tablet (50 mg total) by mouth at bedtime and may repeat dose one time if needed. For sleep  Indication:  Trouble Sleeping  Follow-up Information    [facility] Follow up on 04/09/2016.   Why:  Hospital follow up appt with Nyra JabsFrancis Gill on Friday Sept. 1st at 12:30pm. Call office if you need to reschedule. Please inquire about referral for peer support services if interested. Contact information[address]  [place] [phone]         Follow-up recommendations: Activity:  As tolerated Diet: As recommended by your primary care doctor. Keep all scheduled follow-up appointments as recommended.   Comments: Patient is instructed prior to discharge to: Take all medications as prescribed by his/her mental healthcare provider. Report any adverse effects and or reactions from the medicines to his/her outpatient provider promptly. Patient has been instructed & cautioned: To not engage in alcohol and or illegal drug use while on prescription medicines. In the event of worsening symptoms, patient is instructed to call the crisis hotline, 911 and or go to the nearest ED for appropriate evaluation and treatment of symptoms. To follow-up with his/her primary care provider for your other medical issues, concerns and or health care needs.   Signed: Sanjuana KavaNwoko, Tayja Manzer I, NP, PMHNP,  FNP-BC 04/09/2016, 10:20 AM

## 2016-04-09 NOTE — Tx Team (Signed)
Interdisciplinary Treatment and Diagnostic Plan Update  04/09/2016 Time of Session: 9:30am Samuel Simon MRN: [id number]  Principal Diagnosis: MDD (major depressive disorder), severe (HCC)  Secondary Diagnoses: Principal Problem:   MDD (major depressive disorder), severe (HCC) Active Problems:   Anxiety state   Constipation   Current Medications:  Current Facility-Administered Medications  Medication Dose Route Frequency Provider Last Rate Last Dose  . acetaminophen (TYLENOL) tablet 650 mg  650 mg Oral Q6H PRN Kerry HoughSpencer E Simon, PA-C   650 mg at 04/05/16 [id number]  . alum & mag hydroxide-simeth (MAALOX/MYLANTA) 200-200-20 MG/5ML suspension 30 mL  30 mL Oral Q4H PRN Kerry HoughSpencer E Simon, PA-C      . ARIPiprazole (ABILIFY) tablet 5 mg  5 mg Oral Daily Neysa Hottereina Hisada, MD   5 mg at 04/08/16 0817  . aspirin EC tablet 81 mg  81 mg Oral Daily Kerry HoughSpencer E Simon, PA-C   81 mg at 04/08/16 [id number]  . buPROPion (WELLBUTRIN XL) 24 hr tablet 150 mg  150 mg Oral Daily Neysa Hottereina Hisada, MD   150 mg at 04/08/16 0817  . diphenhydrAMINE-zinc acetate (BENADRYL) 2-0.1 % cream   Topical TID PRN Neysa Hottereina Hisada, MD      . docusate sodium (COLACE) capsule 100 mg  100 mg Oral Daily Shuvon B Rankin, NP   100 mg at 04/08/16 0816  . DULoxetine (CYMBALTA) DR capsule 90 mg  90 mg Oral Daily Neysa Hottereina Hisada, MD   90 mg at 04/08/16 0816  . glipiZIDE (GLUCOTROL) tablet 5 mg  5 mg Oral QAC breakfast Kerry HoughSpencer E Simon, PA-C   5 mg at 04/09/16 [id number]  . hydrOXYzine (ATARAX/VISTARIL) tablet 25 mg  25 mg Oral Q6H PRN Kerry HoughSpencer E Simon, PA-C   25 mg at 04/07/16 2315  . ibuprofen (ADVIL,MOTRIN) tablet 200 mg  200 mg Oral Q6H PRN Neysa Hottereina Hisada, MD   200 mg at 04/06/16 1055  . insulin aspart (novoLOG) injection 0-20 Units  0-20 Units Subcutaneous TID WC Kerry HoughSpencer E Simon, PA-C   3 Units at 04/09/16 [phone]  . magnesium hydroxide (MILK OF MAGNESIA) suspension 30 mL  30 mL Oral Daily PRN Kerry HoughSpencer E Simon, PA-C      . traZODone (DESYREL) tablet 50 mg  50 mg Oral  QHS,MR X 1 Kerry HoughSpencer E Simon, PA-C   50 mg at 04/08/16 2211   PTA Medications: Prescriptions Prior to Admission  Medication Sig Dispense Refill Last Dose  . aspirin EC 81 MG tablet Take 81 mg by mouth daily.   12/30/2015  . cholecalciferol (VITAMIN D) 1000 units tablet Take 1,000 Units by mouth daily.   12/30/2015  . DULoxetine (CYMBALTA) 60 MG capsule Take 60 mg by mouth daily.     Marland Kitchen. glipiZIDE (GLUCOTROL) 5 MG tablet Take 5 mg by mouth daily before breakfast.   12/30/2015  . lisinopril (PRINIVIL,ZESTRIL) 20 MG tablet Take 20 mg by mouth daily. Patient states he does not have HTN, he takes lisinopril for kidney protection from diabetes.   12/30/2015  . topiramate (TOPAMAX) 25 MG capsule Take 100 mg by mouth at bedtime.      . naproxen (NAPROSYN) 500 MG tablet Take 1 tablet (500 mg total) by mouth 2 (two) times daily. (Patient not taking: Reported on 03/31/2016) 30 tablet 0 Not Taking at Unknown time    Treatment Modalities: Medication Management, Group therapy, Case management,  1 to 1 session with clinician, Psychoeducation, Recreational therapy.   Physician Treatment Plan for Primary Diagnosis: MDD (major depressive disorder), severe (HCC) Long Term Goal(s):  Improvement in symptoms so as ready for discharge   Short Term Goals: Ability to verbalize feelings will improve and Ability to disclose and discuss suicidal ideas  Medication Management: Evaluate patient's response, side effects, and tolerance of medication regimen.  Therapeutic Interventions: 1 to 1 sessions, Unit Group sessions and Medication administration.  Evaluation of Outcomes: Adequate for Discharge  RN Treatment Plan for Primary Diagnosis: MDD (major depressive disorder), severe (HCC) Long Term Goal(s): Knowledge of disease and therapeutic regimen to maintain health will improve  Short Term Goals: Ability to participate in decision making will improve and Ability to disclose and discuss suicidal ideas  Medication  Management: RN will administer medications as ordered by provider, will assess and evaluate patient's response and provide education to patient for prescribed medication. RN will report any adverse and/or side effects to prescribing provider.  Therapeutic Interventions: 1 on 1 counseling sessions, Psychoeducation, Medication administration, Evaluate responses to treatment, Monitor vital signs and CBGs as ordered, Perform/monitor CIWA, COWS, AIMS and Fall Risk screenings as ordered, Perform wound care treatments as ordered.  Evaluation of Outcomes: Adequate for Discharge   LCSW Treatment Plan for Primary Diagnosis: MDD (major depressive disorder), severe (HCC) Long Term Goal(s): Safe transition to appropriate next level of care at discharge, Engage patient in therapeutic group addressing interpersonal concerns.  Short Term Goals: Engage patient in aftercare planning with referrals and resources, Increase emotional regulation and Increase skills for wellness and recovery  Therapeutic Interventions: Assess for all discharge needs, 1 to 1 time with Social worker, Explore available resources and support systems, Assess for adequacy in community support network, Educate family and significant other(s) on suicide prevention, Complete Psychosocial Assessment, Interpersonal group therapy.  Evaluation of Outcomes: Adequate for Discharge   Progress in Treatment : Attending groups: Yes  Participating in groups: Yes  Taking medication as prescribed: Yes, MD continuing to assess for appropriate medication regimen  Toleration medication: Yes  Family/Significant other contact made: Treatment team assessing for appropriate contacts  Patient understands diagnosis: Yes  Discussing patient identified problems/goals with staff: Yes  Medical problems stabilized or resolved: Yes  Denies suicidal/homicidal ideation: Yes, patient denies  Issues/concerns per patient self-inventory: None reported  Other:  N/A  New problem(s) identified: None reported at this time    New Short Term/Long Term Goal(s): None at this time    Discharge Plan or Barriers: Patient plans to return home to follow up with outpatient services.     Reason for Continuation of Hospitalization:  Anxiety  Depression  Medication stabilization   Estimated Length of Stay: Discharge anticipated for today 04/09/16    Attendees: Patient:   Physician: Dr. Vanetta Shawl 04/09/2016    Nursing: Waynetta Sandy & Lissa Hoard.,  RN 04/09/2016    RN Care Manager: Onnie Boer, CM 04/09/2016    Social Worker:  Belenda Cruise Lenon Kuennen, LCSW 04/09/2016    Recreational Therapist:       Other:    Other:    :    Scribe for Treatment Team: Samuella Bruin, LCSW Clinical Social Worker [facility] [phone]

## 2016-04-09 NOTE — Progress Notes (Signed)
Pt d/c from the hospital with his daughter. All items returned. D/C instructions given and prescriptions given. Pt denies si and hi.

## 2016-11-23 IMAGING — CR DG CHEST 2V
2 series · 2 of 2 positions shown · non-contrast
Comparison: None.

CLINICAL DATA: Chest heaviness and shortness of breath beginning
about 2 hours ago. More on the left side. Lightheadedness and has a
headache.

EXAM:
CHEST  2 VIEW

[w chest pa *]
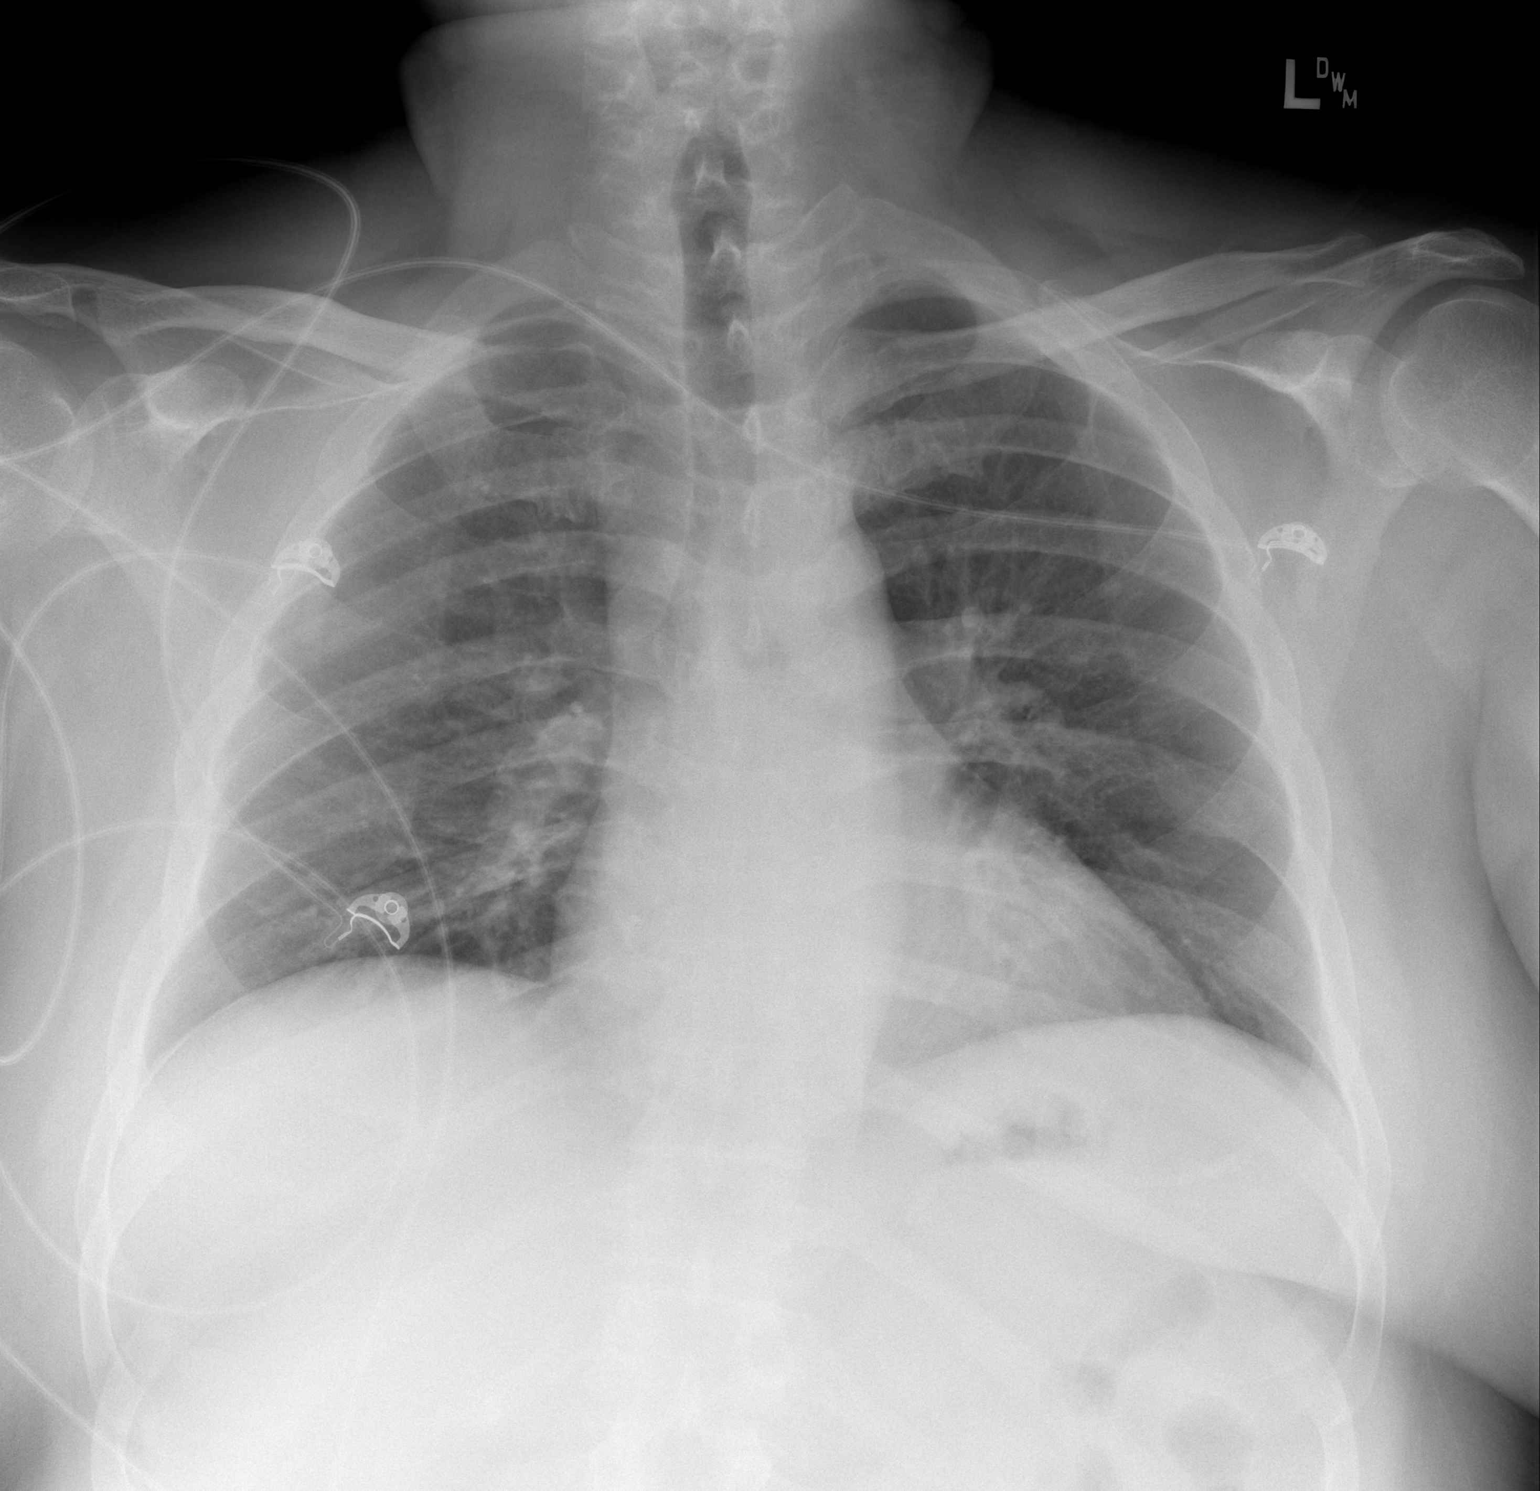

[w chest lat *]
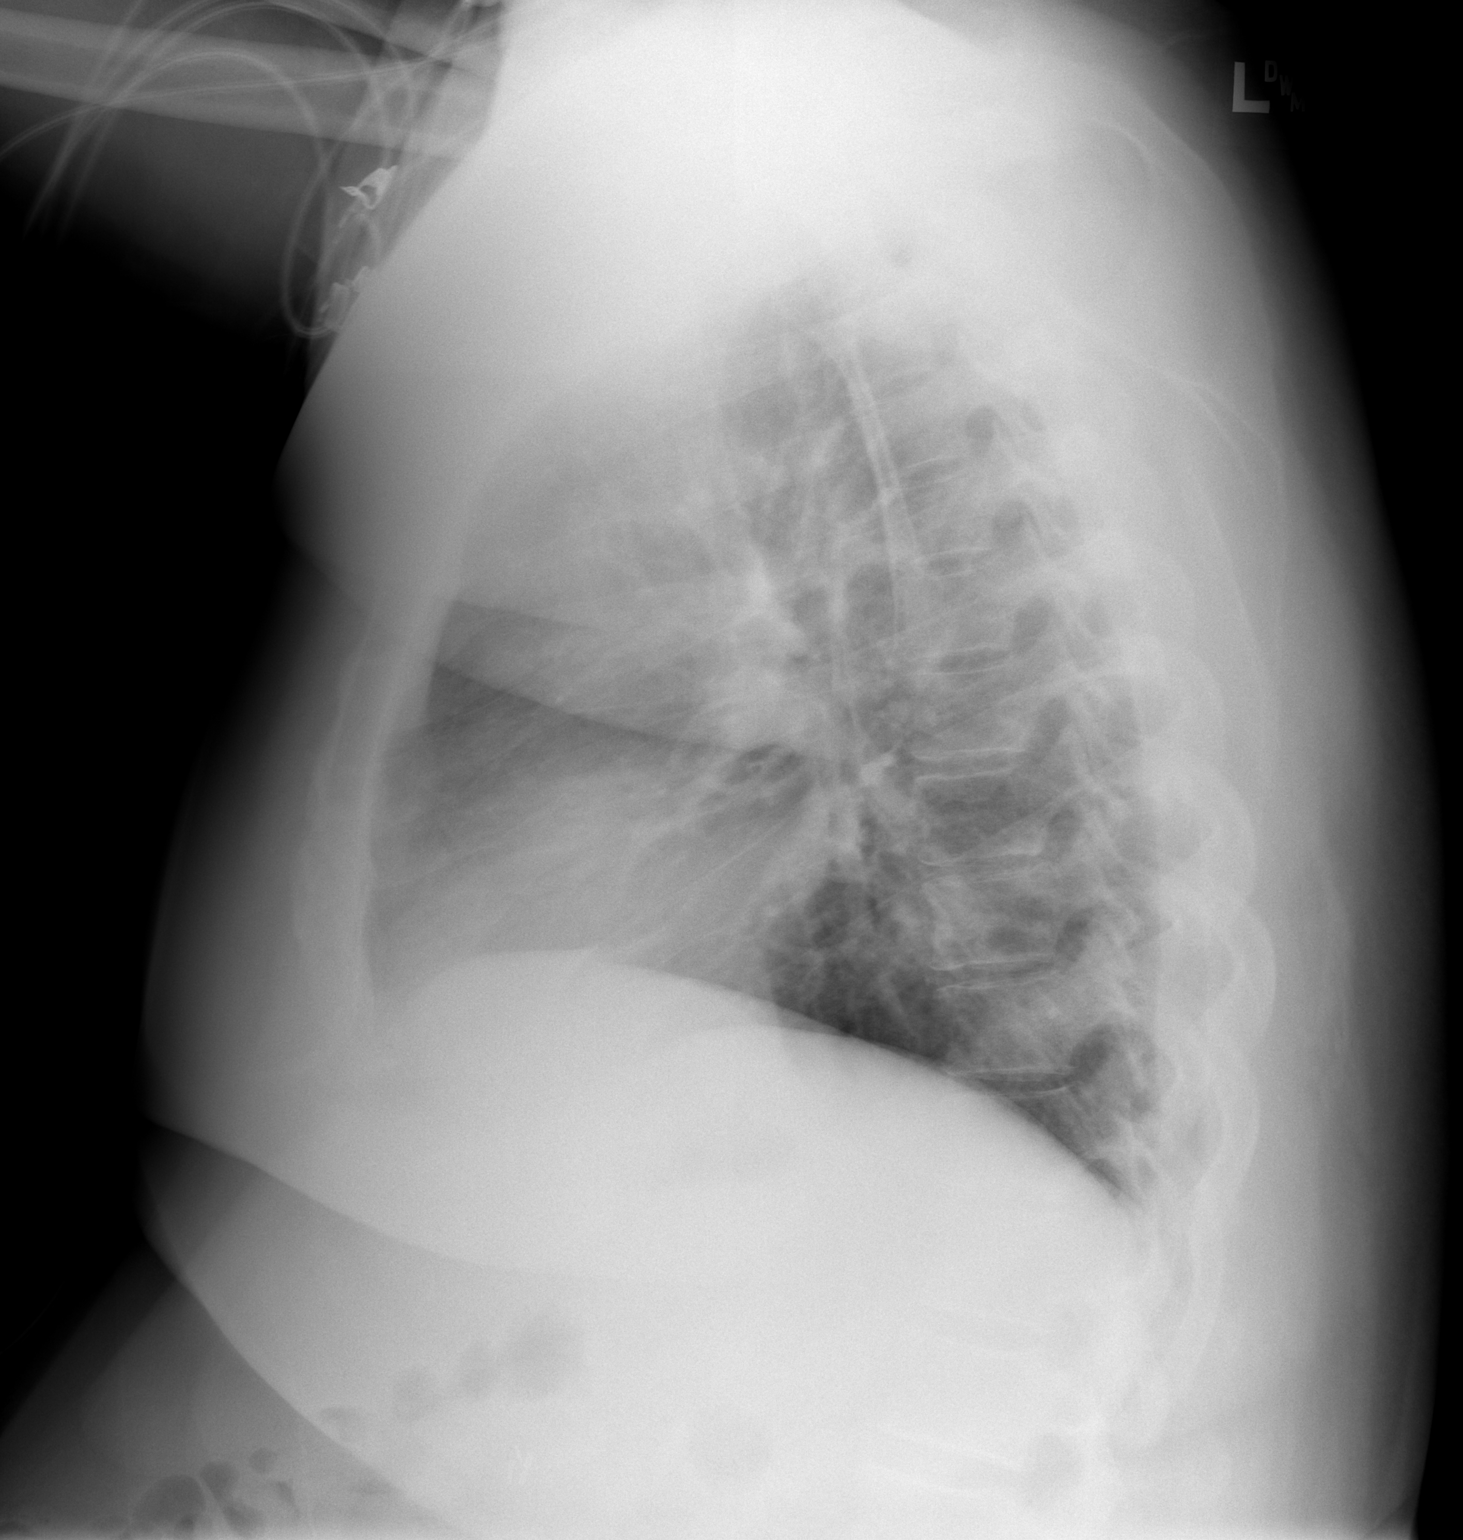

[2 of 2 positions shown; findings below may reference images not displayed]

FINDINGS: The heart size and mediastinal contours are within normal limits.
Both lungs are clear. The visualized skeletal structures are
unremarkable.
IMPRESSION: No active cardiopulmonary disease.
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
